# Patient Record
Sex: Female | Born: 1990 | Race: Asian | Hispanic: No | Smoking: Never smoker
Health system: Southern US, Community
[De-identification: ages and names within clinical notes are randomized; demographics above are authoritative.]

## PROBLEM LIST (undated history)

## (undated) DIAGNOSIS — O139 Gestational [pregnancy-induced] hypertension without significant proteinuria, unspecified trimester: Secondary | ICD-10-CM

## (undated) DIAGNOSIS — D509 Iron deficiency anemia, unspecified: Principal | ICD-10-CM

## (undated) HISTORY — DX: Iron deficiency anemia, unspecified: D50.9

---
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
## 2008-04-20 ENCOUNTER — Emergency Department (HOSPITAL_COMMUNITY): Admission: EM | Admit: 2008-04-20 | Discharge: 2008-04-20 | Payer: Self-pay | Admitting: Emergency Medicine

## 2009-10-25 IMAGING — US US OB FOLLOW-UP
1 series · 14 of 22 positions shown · non-contrast
Comparison: none

OBSTETRICAL ULTRASOUND:

 This ultrasound exam was performed in the [HOSPITAL] Ultrasound Department.  The OB US report was generated in the AS system, and faxed to the ordering physician.  This report is also available in [REDACTED] PACS.

[Series 1: us ob follow-up · 0.30mm/px · 14 of 22 slices shown]
[im 1/22]
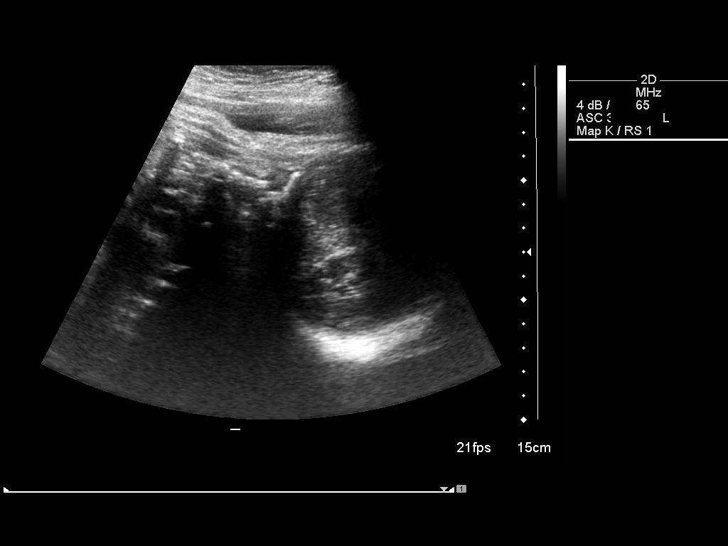
[im 3/22]
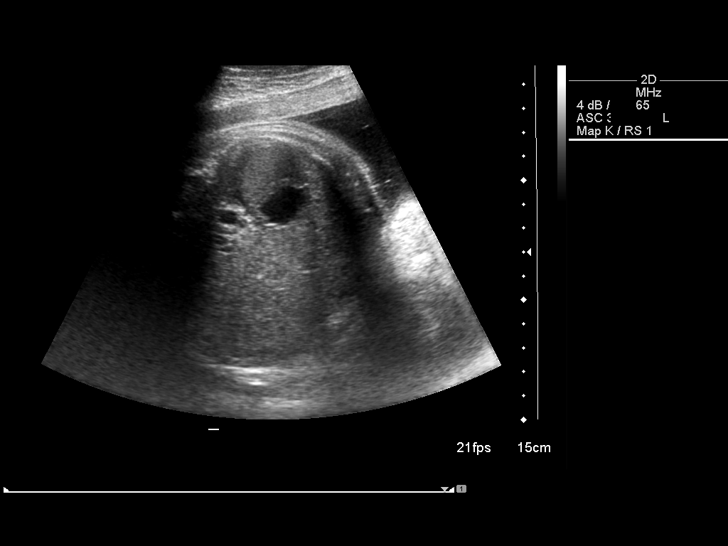
[im 4/22]
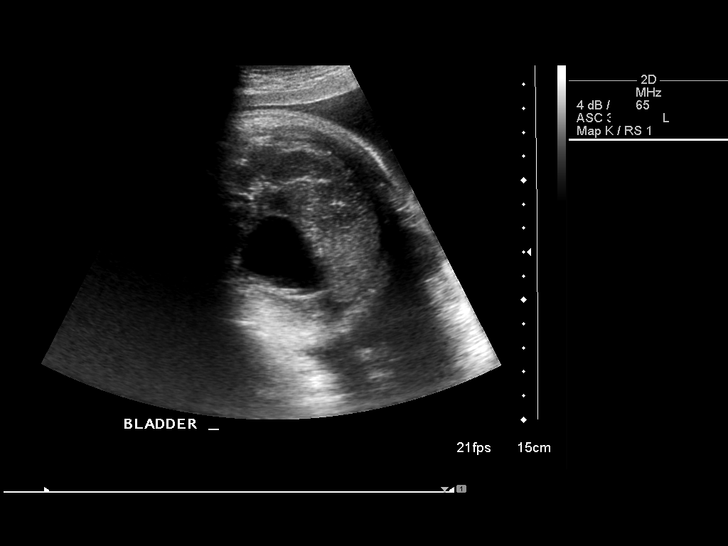
[im 6/22]
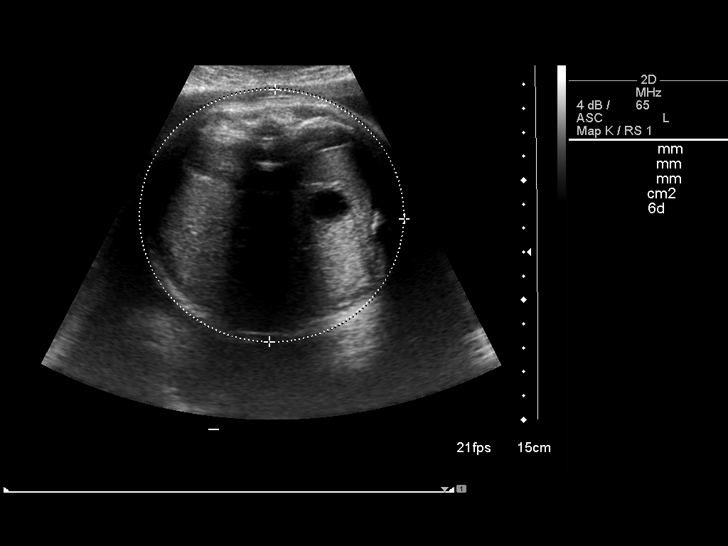
[im 8/22]
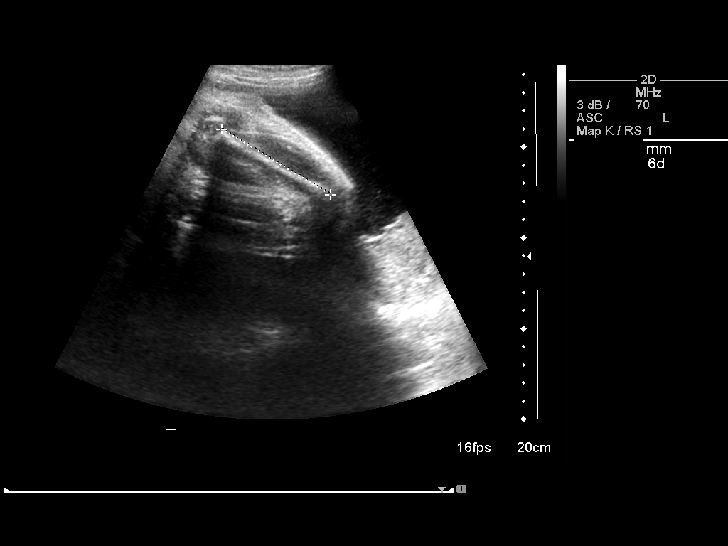
[im 9/22]
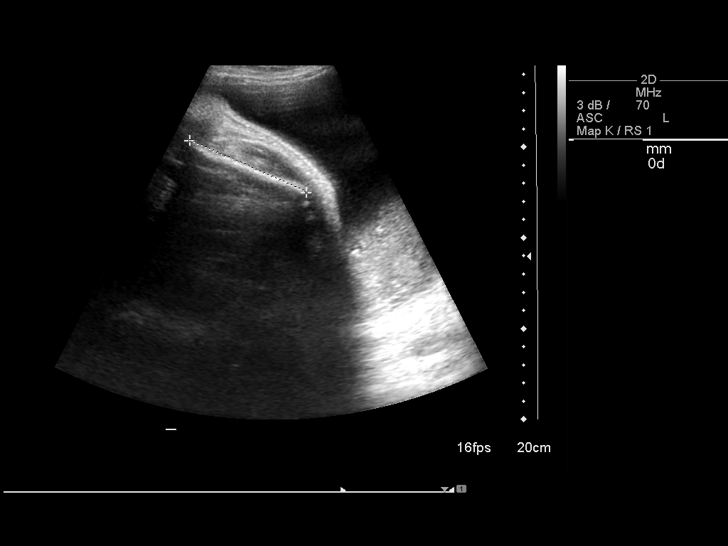
[im 11/22]
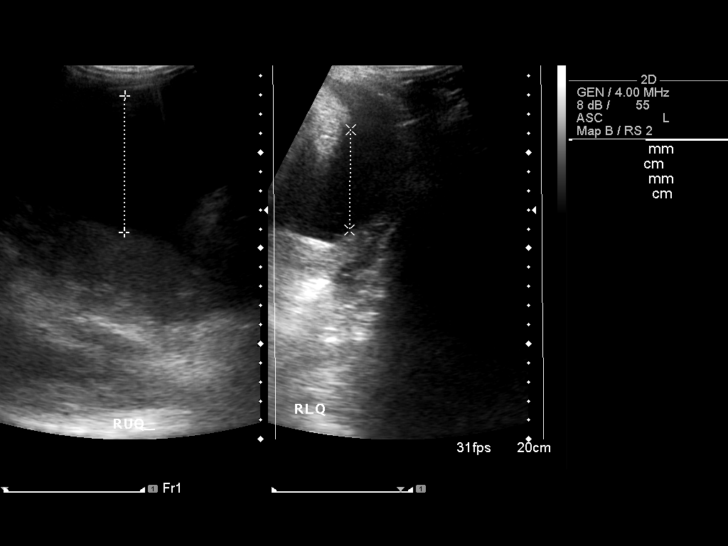
[im 12/22]
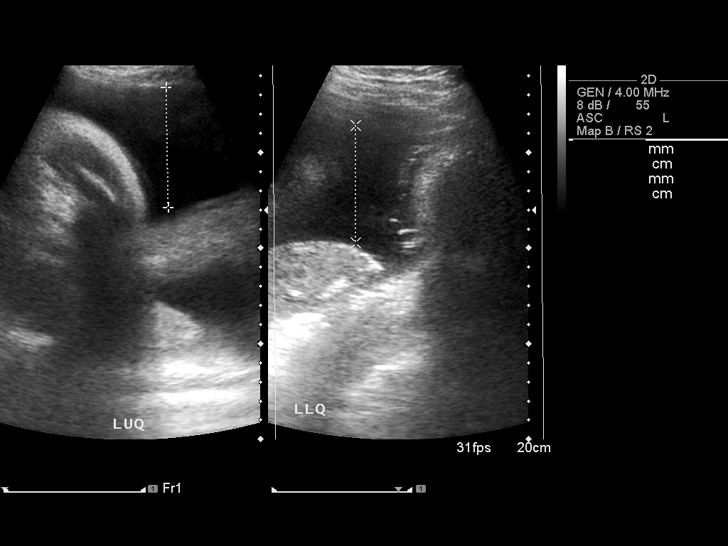
[im 14/22]
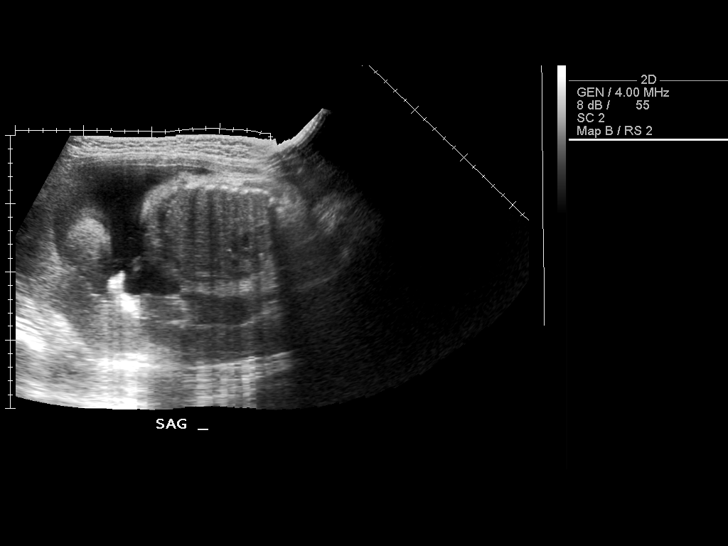
[im 15/22]
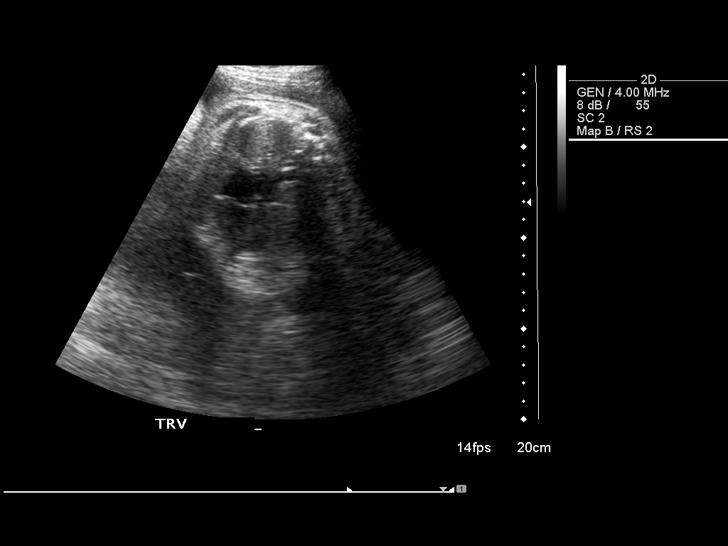
[im 17/22]
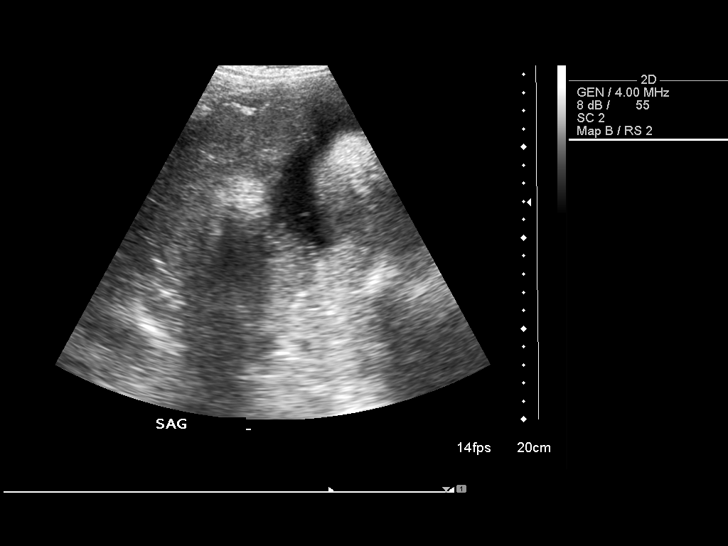
[im 19/22]
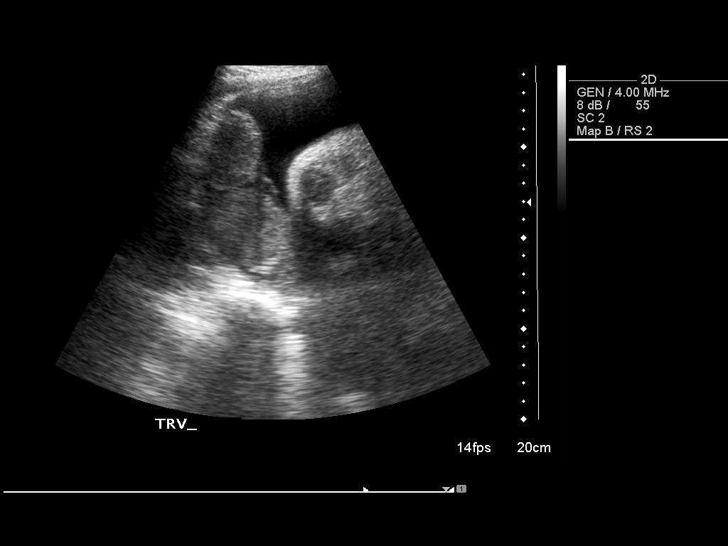
[im 20/22]
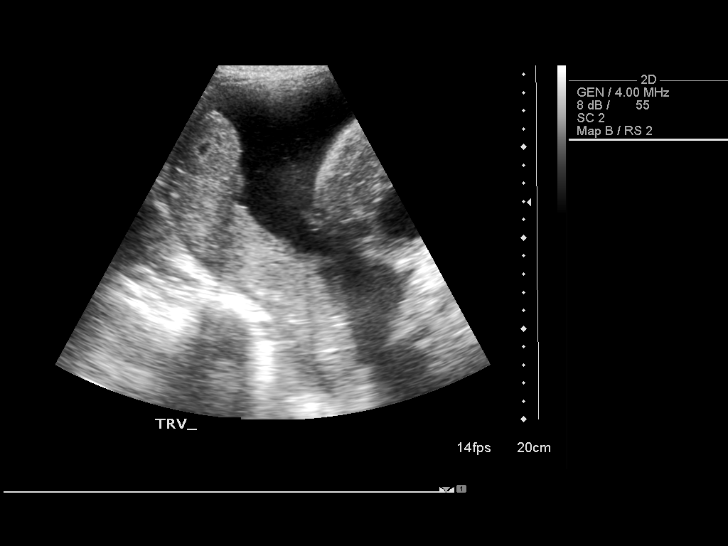
[im 22/22]
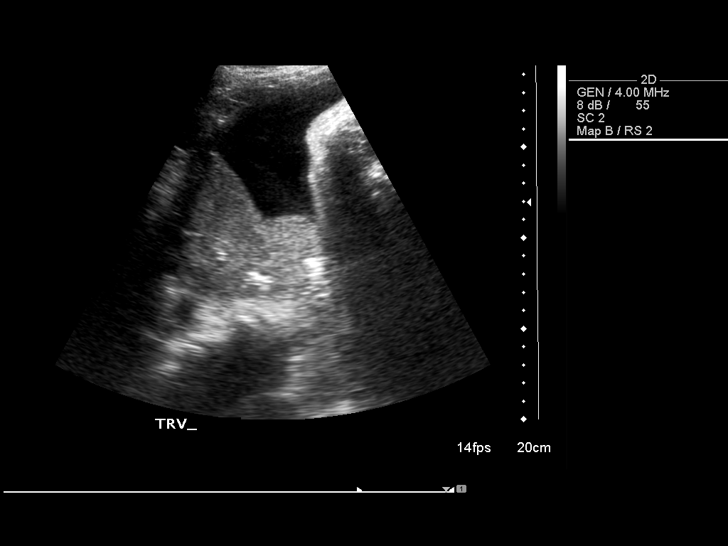

[14 of 22 positions shown; findings below may reference images not displayed]

IMPRESSION: See AS Obstetric US report.

## 2010-04-22 ENCOUNTER — Emergency Department (HOSPITAL_COMMUNITY)
Admission: EM | Admit: 2010-04-22 | Discharge: 2010-04-22 | Disposition: A | Discharge status: Home / Self Care | Payer: Medicaid Other | Attending: Emergency Medicine | Admitting: Emergency Medicine

## 2010-04-22 DIAGNOSIS — R070 Pain in throat: Secondary | ICD-10-CM | POA: Insufficient documentation

## 2010-07-11 NOTE — Discharge Summary (Signed)
[name], [name]                    ACCOUNT NO.:  [id number]   MEDICAL RECORD NO.:  [id number]          PATIENT TYPE:  INP   LOCATION:                                FACILITY:  [facility]   PHYSICIAN:  Norton Blizzard, MD    DATE OF BIRTH:  08/16/1990   DATE OF ADMISSION:  01/16/2007  DATE OF DISCHARGE:  01/23/2007                               DISCHARGE SUMMARY   ADMISSION DIAGNOSIS:  A 20 year old G1, P0, at 38-1/7 weeks, admitted  for induction of labor secondary to mild preeclampsia.   DISCHARGE DIAGNOSES:  1. Failed induction of labor.  2. Primary cesarean section for failure to progress.   BRIEF HOSPITAL COURSE:  The patient is a 20 year old G1, P0, at 38-1/7  weeks who was admitted on January 20, 2007, for induction of labor for  mild preeclampsia.  The patient's induction proceeded until she was 4 cm  dilated.  At that point, the patient did not have any further cervical  dilation despite adequate contractions for several hours.  Moreover, the  patient's fetal heart rate tracing started to have decreased variability  and concerning decelerations.  Given her failure to progress and  concerning fetal heart tracing, the patient was consented for cesarean  section.  She underwent an uncomplicated primary cesarean section.  Estimated blood loss 800 mL.  For further details of this operation,  please refer to the dictated report.  The patient had an uncomplicated  postoperative course.  She was in the ICU for magnesium sulfate seizure  prophylaxis for 24 hours after delivery, and her blood pressures  remained stable on oral labetalol.  On postoperative day #2, the patient  requested early discharge; given that her vital signs were stable, and  there were no signs and symptoms of infection, the decision was made to  send the patient home.   DISCHARGE CONDITION:  Stable.   DISCHARGE MEDICATIONS:  1. Percocet 5/325 one tablet by mouth every 6 hours as needed for      pain.  2. Motrin  600 mg 1 tablet by mouth every 6 hours as needed for pain.  3. Colace 100 mg 1 tablet by mouth twice daily.  4. Ferrous sulfate 325 mg 1 tablet by mouth twice daily.  5. Prenatal vitamins 1 tablet by mouth daily.  6. Labetalol 200mg  by mouth twice daily.   DISCHARGE INSTRUCTIONS:  The patient was told to call or return to the  hospital for any fevers, chills, sweats, problems with her incision,  severe headaches, visual changes, right upper quadrant or epigastric  pain, abnormal bleeding, incisional problems or any other concerns.  She  was told to avoid lifting anything greater than 15 pounds,  and to observe pelvic rest for the next six weeks.  As for her staple  removal, a [facility] nurse will come to her hourse on postoperative day  #6 (01/28/07). Patient will follow up at the [facility]  Department for her 6-week postpartum visit.      Norton Blizzard, MD  Electronically Signed  UAD/MEDQ  D:  01/23/2007  T:  01/23/2007  Job:  [id number]

## 2010-07-11 NOTE — Op Note (Signed)
NAMEALYSHIA, KERNAN                    ACCOUNT NO.:  [id number]   MEDICAL RECORD NO.:  [id number]          PATIENT TYPE:  INP   LOCATION:  9374                          FACILITY:  [facility]   PHYSICIAN:  Lesly Dukes, M.D. DATE OF BIRTH:  1990-12-08   DATE OF PROCEDURE:  01/21/2007  DATE OF DISCHARGE:                               OPERATIVE REPORT   PREOP DIAGNOSIS:  20 year-old gravida 1 para 0 at 38+ weeks  estimated gestational age with:  1. Failure to progress.  2. Chorioamnionitis.  3. Preeclampsia.  4. Decreased fetal heart rate tracing variability.   POSTOP DIAGNOSIS:  1. 20 year-old gravida 1 para 0 at 38+ weeks estimated      gestational age with:      a.     Failure to progress.      b.     Chorioamnionitis.      c.     Preeclampsia.      d.     Decreased fetal heart rate tracing variability.  2. Right occiptotransverse position, transverse.   PROCEDURE:  Primary low transverse cesarean section.   SURGEON:  Elsie Lincoln, MD.   ANESTHESIA:  Epidural.   PATHOLOGY:  Placenta.   EBL:  800.   COMPLICATIONS:  None.   FINDINGS:  Viable female infant, Apgars 9 at one minute and 9 at five  minutes, cord pH 7.21, grossly normal uterus, ovaries and fallopian  tubes.   PROCEDURE:  After informed consent was obtained the patient was taken to  the operating room where epidural anesthesia was found to be adequate.  The patient was placed in the dorsal supine position with a leftward  tilt and a Foley was already in the bladder.  A Pfannenstiel skin  incision was made with a scalpel and carried down to the fascia.  The  fascia was incised in the midline and this incision was extended  bilaterally with the Mayo scissors.  The superior and inferior aspects  of the fascial incision were grasped with Kocher clamps, tented up, and  dissected off sharply and bluntly from the underlying layers of rectus  muscles.  The rectus muscles were separated in the midline.  The  peritoneum was entered bluntly, extended both superiorly and inferiorly  with good visualization of the bladder.  Alexis retractor was placed  into the abdomen and a bladder flap was created and the bladder blade  was inserted.  The uterine incision was made in a transverse fashion in  the lower uterine segment and this incision was extended bilaterally  with the bandage scissors.  The head delivered atraumatically, the nose  and mouth were suctioned.  The rest of the baby's body delivered easily.  The cord was clamped and cut and the baby was handed off to the awaiting  pediatrician.  Cord gas was sent and cord blood was sent for type and  screen.  The placenta delivered manually and had a three-vessel cord.  The uterus was exteriorized and cleared of all clots and debris.  The  uterine incision was closed with #  0 Vicryl in a running locked fashion  and a second suture of #0 Vicryl was used to reinforce the incision and  aid in hemostasis.  The uterus was returned to the abdomen and was noted  to be hemostatic on tension.  The abdomen was copiously irrigated with  warm normal saline and again the uterus was noted to be hemostatic off  tension.  The peritoneum and rectus muscles were noted to be hemostatic.  The fascia was closed with #0 Vicryl in a running fashion and noted to  be hemostatic.  The subcutaneous tissue was copiously irrigated and  noted to be hemostatic.  The skin was closed with staples.  The patient  tolerated the procedure well.  Sponge, lap, instrument and needle count  were correct x2 and patient went to the recovery room in stable  condition.      Lesly Dukes, M.D.  Electronically Signed     KHL/MEDQ  D:  01/21/2007  T:  01/21/2007  Job:  [id number]

## 2010-12-05 LAB — CBC
HCT: 33.8 — ABNORMAL LOW
Hemoglobin: 9 — ABNORMAL LOW
Hemoglobin: 9.7 — ABNORMAL LOW
MCHC: 31.4
MCHC: 32.4
MCV: 70.4 — ABNORMAL LOW
Platelets: 295
Platelets: 322
Platelets: 374
RBC: 3.97
RBC: 4.8
RDW: 13.7
RDW: 14.1
WBC: 10.6
WBC: 13.9 — ABNORMAL HIGH

## 2010-12-05 LAB — I-STAT 8, (EC8 V) (CONVERTED LAB)
Bicarbonate: 21.6
Chloride: 110
Glucose, Bld: 93
HCT: 33 — ABNORMAL LOW
Hemoglobin: 11.2 — ABNORMAL LOW
Potassium: 4.2
Sodium: 139
TCO2: 23

## 2010-12-05 LAB — COMPREHENSIVE METABOLIC PANEL
Albumin: 2.8 — ABNORMAL LOW
Alkaline Phosphatase: 199 — ABNORMAL HIGH
CO2: 20
Calcium: 9.6
Chloride: 105
Creatinine, Ser: 0.68
Glucose, Bld: 78
Potassium: 3.6
Sodium: 134 — ABNORMAL LOW

## 2010-12-05 LAB — URINALYSIS, ROUTINE W REFLEX MICROSCOPIC
Protein, ur: NEGATIVE
pH: 7.5

## 2010-12-05 LAB — DIFFERENTIAL
Basophils Relative: 0
Eosinophils Absolute: 0.5
Lymphocytes Relative: 14 — ABNORMAL LOW
Lymphs Abs: 1.5
Monocytes Relative: 4
Neutrophils Relative %: 77 — ABNORMAL HIGH

## 2010-12-05 LAB — HEPATIC FUNCTION PANEL
ALT: 16
Alkaline Phosphatase: 127 — ABNORMAL HIGH
Total Bilirubin: 0.5

## 2010-12-05 LAB — URINALYSIS, DIPSTICK ONLY
Bilirubin Urine: NEGATIVE
Glucose, UA: NEGATIVE
Nitrite: NEGATIVE
Urobilinogen, UA: 0.2

## 2010-12-05 LAB — URINE MICROSCOPIC-ADD ON

## 2010-12-05 LAB — URINE CULTURE

## 2010-12-05 LAB — POCT I-STAT CREATININE
Creatinine, Ser: 0.8
Operator id: 234501

## 2011-09-29 ENCOUNTER — Ambulatory Visit: Payer: Self-pay | Admitting: Family Medicine

## 2011-09-29 VITALS — BP 111/76 | HR 87 | Temp 98.7°F | Resp 16 | Ht <= 58 in | Wt 131.0 lb

## 2011-09-29 DIAGNOSIS — R52 Pain, unspecified: Secondary | ICD-10-CM

## 2011-09-29 DIAGNOSIS — R51 Headache: Secondary | ICD-10-CM

## 2011-09-29 DIAGNOSIS — B349 Viral infection, unspecified: Secondary | ICD-10-CM

## 2011-09-29 DIAGNOSIS — R1031 Right lower quadrant pain: Secondary | ICD-10-CM

## 2011-09-29 DIAGNOSIS — I889 Nonspecific lymphadenitis, unspecified: Secondary | ICD-10-CM

## 2011-09-29 LAB — POCT URINALYSIS DIPSTICK
Bilirubin, UA: NEGATIVE
Blood, UA: NEGATIVE
Leukocytes, UA: NEGATIVE
Spec Grav, UA: 1.025
Urobilinogen, UA: 1
pH, UA: 6

## 2011-09-29 LAB — POCT CBC
Granulocyte percent: 47.5 %G (ref 37–80)
HCT, POC: 40.7 % (ref 37.7–47.9)
Lymph, poc: 2.5 (ref 0.6–3.4)
POC MID %: 11.5 %M (ref 0–12)
RBC: 5.62 M/uL — AB (ref 4.04–5.48)

## 2011-09-29 LAB — POCT UA - MICROSCOPIC ONLY
Casts, Ur, LPF, POC: NEGATIVE
Crystals, Ur, HPF, POC: NEGATIVE
Epithelial cells, urine per micros: NEGATIVE
Yeast, UA: NEGATIVE

## 2011-09-29 MED ORDER — BUTALBITAL-APAP-CAFFEINE 50-325-40 MG PO TABS: 1.0000 | ORAL_TABLET | Freq: Four times a day (QID) | ORAL | Status: DC | PRN

## 2011-09-29 MED ORDER — DOXYCYCLINE HYCLATE 100 MG PO TABS: 100.0000 mg | ORAL_TABLET | Freq: Two times a day (BID) | ORAL | Status: AC

## 2011-09-29 NOTE — Progress Notes (Signed)
Subjective: 21 year old [place] lady who comes in here complaining of not feeling well since last Saturday. She's had a headache which has been persistent. She took Tylenol and ibuprofen without relief. She has had just a lot of generalized body aches. It is persisted all week. 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 She has otherwise been healthy. Denies any upper respiratory infection. No sore throat. Cough. No nausea vomiting or diarrhea. Her last menstrual cycle was June 27 to July of 1. She has done a home pregnancy test which was negative. She does not think she is pregnant. She is not on contraception. She had one insect bite or 4, does not know what bit her. It stayed red or purple for a while. This was back in June. She denies fevers. She denies other rashes.  Objective: Alert oriented lady who doesn't look like she feels were well. Her TMs are normal. Eyes PERRLA. Fundi benign. Discs flat. Throat clear. Neck supple with nodes on the right and left in the posterior triangles. No   thyromegaly. No carotid bruits. Chest is clear to auscultation. Heart regular without murmurs. Abdomen soft. He is tender in the suprapubic and right lower quadrant areas. Extremities are unremarkable. Skin unremarkable with the exception of a little cluster of about 8 old purplish slightly crusted dots in a close pattern less than 5 cm in diameter. Each of little spots is carpal is palpable slightly crusted and measures 2 or 3 mm. Probably an old excoriated rash. She has a right post auricular node. No axillary or inguinal nodes  Assessment: Headache Myalgias Delayed menses Lymphadenopathy   Plan: Check labs  Results for orders placed in visit on 09/29/11  POCT CBC      Component Value Range   WBC 6.2  4.6 - 10.2 K/uL   Lymph, poc 2.5  0.6 - 3.4   POC LYMPH PERCENT 41.0  10 - 50 %L   MID (cbc) 0.7  0 - 0.9   POC MID % 11.5  0 - 12 %M   POC Granulocyte 2.9  2 - 6.9   Granulocyte  percent 47.5  37 - 80 %G   RBC 5.62 (*) 4.04 - 5.48 M/uL   Hemoglobin 12.2  12.2 - 16.2 g/dL   HCT, POC 16.1  09.6 - 47.9 %   MCV 72.5 (*) 80 - 97 fL   MCH, POC 21.7 (*) 27 - 31.2 pg   MCHC 30.0 (*) 31.8 - 35.4 g/dL   RDW, POC 04.5     Platelet Count, POC 397  142 - 424 K/uL   MPV 8.3  0 - 99.8 fL  POCT URINALYSIS DIPSTICK      Component Value Range   Color, UA yellow     Clarity, UA clear     Glucose, UA neg     Bilirubin, UA neg     Ketones, UA neg     Spec Grav, UA 1.025     Blood, UA neg     pH, UA 6.0     Protein, UA neg     Urobilinogen, UA 1.0     Nitrite, UA neg     Leukocytes, UA Negative    POCT UA - MICROSCOPIC ONLY      Component Value Range   WBC, Ur, HPF, POC 0-1     RBC, urine, microscopic 0-2     Bacteria, U Microscopic trace     Mucus, UA small  Epithelial cells, urine per micros neg     Crystals, Ur, HPF, POC neg     Casts, Ur, LPF, POC neg     Yeast, UA neg    POCT URINE PREGNANCY      Component Value Range   Preg Test, Ur Negative       Plan: CBC, UA, urine hCG

## 2011-09-29 NOTE — Patient Instructions (Addendum)
This probably is some kind of virus infection, though in tick season we need to always be cautious about the risks of some of the possible tick infections. With the enlarged lymph glands I'm going to put you on an antibiotic.  Doxycycline 100 mg twice daily for infection  Take tylenol or ibuprofen for the headache.  If not doing better, take Fioricet for headache  Return if no better or if worse.  I will let you know the result of the mono test

## 2011-10-02 LAB — EPSTEIN-BARR VIRUS VCA, IGM: EBV VCA IgM: <10 U/mL (ref <36.0)

## 2011-10-08 ENCOUNTER — Encounter: Payer: Self-pay | Admitting: *Deleted

## 2011-11-30 ENCOUNTER — Ambulatory Visit (INDEPENDENT_AMBULATORY_CARE_PROVIDER_SITE_OTHER): Payer: 59 | Admitting: Physician Assistant

## 2011-11-30 VITALS — BP 98/64 | HR 112 | Temp 98.7°F | Resp 16 | Ht <= 58 in | Wt 131.0 lb

## 2011-11-30 DIAGNOSIS — R300 Dysuria: Secondary | ICD-10-CM

## 2011-11-30 DIAGNOSIS — N390 Urinary tract infection, site not specified: Secondary | ICD-10-CM

## 2011-11-30 LAB — POCT URINALYSIS DIPSTICK
Glucose, UA: NEGATIVE
Protein, UA: 100
Spec Grav, UA: 1.02
Urobilinogen, UA: 0.2
pH, UA: 6.5

## 2011-11-30 LAB — POCT UA - MICROSCOPIC ONLY
Casts, Ur, LPF, POC: NEGATIVE
Crystals, Ur, HPF, POC: NEGATIVE
Mucus, UA: POSITIVE
Yeast, UA: NEGATIVE

## 2011-11-30 MED ORDER — SULFAMETHOXAZOLE-TMP DS 800-160 MG PO TABS: 1.0000 | ORAL_TABLET | Freq: Two times a day (BID) | ORAL | Status: DC

## 2011-11-30 MED ORDER — PHENAZOPYRIDINE HCL 200 MG PO TABS: 200.0000 mg | ORAL_TABLET | Freq: Three times a day (TID) | ORAL | Status: DC | PRN

## 2011-11-30 NOTE — Progress Notes (Signed)
  Subjective:    Patient ID: Sandra Olsen, female    DOB: [birth year], 21 y.o.   MRN: [id number]  HPI  21 yr old female presents with a 1 day h/o dysuria and frequency. No f/c. No abdominal pain. No pelvic pain or vaginal discharge.  Review of Systems  All other systems reviewed and are negative.       Objective:   Physical Exam  Nursing note and vitals reviewed. Constitutional: She is oriented to person, place, and time. She appears well-developed and well-nourished.  HENT:  Head: Normocephalic and atraumatic.  Cardiovascular: Normal rate, regular rhythm and normal heart sounds.   Pulmonary/Chest: Effort normal and breath sounds normal.  Abdominal:       No CVA tenderness  Neurological: She is alert and oriented to person, place, and time.  Skin: Skin is warm and dry.  Psychiatric: She has a normal mood and affect. Her behavior is normal.     Results for orders placed in visit on 11/30/11  POCT UA - MICROSCOPIC ONLY      Component Value Range   WBC, Ur, HPF, POC 18-25     RBC, urine, microscopic 18-20     Bacteria, U Microscopic 2+     Mucus, UA positive     Epithelial cells, urine per micros 3-5     Crystals, Ur, HPF, POC neg     Casts, Ur, LPF, POC neg     Yeast, UA neg    POCT URINALYSIS DIPSTICK      Component Value Range   Color, UA yellow     Clarity, UA cloudy     Glucose, UA neg     Bilirubin, UA neg     Ketones, UA neg     Spec Grav, UA 1.020     Blood, UA large     pH, UA 6.5     Protein, UA 100     Urobilinogen, UA 0.2     Nitrite, UA positive     Leukocytes, UA large (3+)          Assessment & Plan:  UTI-start septra. Increase water intake. Rest. Urine sent for culture.

## 2011-11-30 NOTE — Patient Instructions (Addendum)
Drink 60-80 ounces water/day

## 2011-12-02 LAB — URINE CULTURE: Colony Count: >=100000

## 2012-07-04 ENCOUNTER — Ambulatory Visit (INDEPENDENT_AMBULATORY_CARE_PROVIDER_SITE_OTHER): Payer: 59 | Admitting: Family Medicine

## 2012-07-04 VITALS — BP 112/80 | HR 78 | Temp 98.6°F | Resp 16 | Ht <= 58 in | Wt 128.4 lb

## 2012-07-04 DIAGNOSIS — Z0000 Encounter for general adult medical examination without abnormal findings: Secondary | ICD-10-CM

## 2012-07-04 NOTE — Patient Instructions (Addendum)
I will be in touch with your pap result.  I do recommend that you have an eye exam because your vision is 20/50 without correction.

## 2012-07-04 NOTE — Progress Notes (Signed)
[facility] [address], [place] [address] [id number]  Date:  07/04/2012   Name:  Sandra Olsen   DOB:  May 30, 1990   MRN:  [id number]  PCP:  No primary provider on file.    Chief Complaint: Annual Exam   History of Present Illness:  Sandra Olsen is a 22 y.o. very pleasant female patient who presents with the following:  She is here today for a CPE and form to return to school at [employer].  She plans to study medical assisting.  Per her report the HD has already gone over her shots and done her PPD- she does not need these things addressed today.  She does not want any labs performed (BW).   She is generally healthy.   Her LMP was just about one month ago.  She is often irregular so she is not concerned about not having her menses yet.  She has a 24 year old girl at home.  They are not using any contraception at this time.    She declines to have an HCG today.  Here with her female partner.  Refused to do anything not required for her [employer] physical (besides pap).  Refused to do whisper test or color vision test although it was on her form.  She would like to do a pap today as she is overdue.  No history of abnormal pap that she can recall.    There are no active problems to display for this patient.   No past medical history on file.  Past Surgical History  Procedure Laterality Date  . Cesarean section      History  Substance Use Topics  . Smoking status: Never Smoker   . Smokeless tobacco: Not on file  . Alcohol Use: No    Family History  Problem Relation Age of Onset  . Diabetes Father     No Known Allergies  Medication list has been reviewed and updated.  No current outpatient prescriptions on file prior to visit.   No current facility-administered medications on file prior to visit.    Review of Systems:  As per HPI- otherwise negative.   Physical Examination: Filed Vitals:   07/04/12 1723  BP: 112/80  Pulse: 78  Temp: 98.6 F (37 C)   Resp: 16   Filed Vitals:   07/04/12 1723  Height: 4' 9.75" (1.467 m)  Weight: 128 lb 6.4 oz (58.242 kg)   Body mass index is 27.06 kg/(m^2). Ideal Body Weight: Weight in (lb) to have BMI = 25: 118.3  GEN: WDWN, NAD, Non-toxic, A & O x 3 HEENT: Atraumatic, Normocephalic. Neck supple. No masses, No LAD.  Bilateral TM wnl, oropharynx normal.  PEERL,EOMI.   Ears and Nose: No external deformity. CV: RRR, No M/G/R. No JVD. No thrill. No extra heart sounds. PULM: CTA B, no wheezes, crackles, rhonchi. No retractions. No resp. distress. No accessory muscle use. ABD: S, NT, ND, +BS. No rebound. No HSM. EXTR: No c/c/e NEURO Normal gait.  PSYCH: Normally interactive. Conversant. Not depressed or anxious appearing.  Calm demeanor.  Breast exam: normal, no masses, dimpling or discharge GU: normal internal and external exam, no CMT, no adnexal masses or tenderness  Performed pap with spatula only (no brush).   Assessment and Plan: Physical exam, annual - Plan: Pap IG, CT/NG w/ reflex HPV when ASC-U  Performed pap for her today and completed form.  She states she does have glasses but did not bring them  with her today.   Will plan further follow- up pending labs. Refused any other services or labs today.  Advised her to take a home HCG if no menses in the next 2 weeks.    Signed Abbe Amsterdam, MD

## 2012-07-07 LAB — PAP IG, CT-NG, RFX HPV ASCU: Chlamydia Probe Amp: NEGATIVE

## 2012-07-08 ENCOUNTER — Encounter: Payer: Self-pay | Admitting: Family Medicine

## 2012-07-16 ENCOUNTER — Telehealth: Payer: Self-pay

## 2012-07-16 NOTE — Telephone Encounter (Signed)
Pt came in on May 9th and gave dr the phy form and she was only give copy of the form but she needs the original copy Call back number is [phone]

## 2012-07-17 NOTE — Telephone Encounter (Signed)
Patient called this morning. I told her that I will look for her form and that it may have already be sent for scanning. If that's the case we would no longer have the original. She would have to bring another form if a copy is not acceptable. Will call her back and ok to leave voicemail per patient.

## 2012-07-17 NOTE — Telephone Encounter (Signed)
Located form. It was only one page. Copied form for our records. Lmom for patient to pick up form.

## 2012-11-02 ENCOUNTER — Ambulatory Visit (INDEPENDENT_AMBULATORY_CARE_PROVIDER_SITE_OTHER): Payer: 59 | Admitting: Physician Assistant

## 2012-11-02 VITALS — BP 116/68 | HR 82 | Temp 98.4°F | Resp 16 | Ht <= 58 in | Wt 126.0 lb

## 2012-11-02 DIAGNOSIS — R319 Hematuria, unspecified: Secondary | ICD-10-CM

## 2012-11-02 DIAGNOSIS — R300 Dysuria: Secondary | ICD-10-CM

## 2012-11-02 LAB — POCT URINALYSIS DIPSTICK
Bilirubin, UA: NEGATIVE
Nitrite, UA: NEGATIVE
Protein, UA: 30
pH, UA: 7

## 2012-11-02 LAB — POCT UA - MICROSCOPIC ONLY
Epithelial cells, urine per micros: NEGATIVE
Yeast, UA: NEGATIVE

## 2012-11-02 MED ORDER — PHENAZOPYRIDINE HCL 200 MG PO TABS: 200.0000 mg | ORAL_TABLET | Freq: Three times a day (TID) | ORAL | Status: DC | PRN

## 2012-11-02 MED ORDER — CIPROFLOXACIN HCL 500 MG PO TABS: 500.0000 mg | ORAL_TABLET | Freq: Two times a day (BID) | ORAL | Status: DC

## 2012-11-02 NOTE — Patient Instructions (Signed)
Get plenty of rest and drink at least 64 ounces of water daily. Complete the antibiotic. It is important that you return in about 2 weeks to reassess the blood in the urine (it should resolve completely).  Come either before your next period starts, or after your next period stops completely.

## 2012-11-02 NOTE — Progress Notes (Signed)
  Subjective:    Patient ID: Sandra Olsen, female    DOB: Mar 10, 1990, 22 y.o.   MRN: [id number]  HPI This 22 y.o. female presents for evaluation of dysuria, urgency and frequency beginning about 12 noon today.  Some hematuria. No fever. Chills. No back or abdominal pain.  No vaginal discharge.  No nausea, vomiting or diarrhea.  Monogamous sex with her husband.  Medications, allergies, past medical history, surgical history, family history, social history and problem list reviewed.  Review of Systems As above.    Objective:   Physical Exam  Vitals reviewed. Constitutional: She is oriented to person, place, and time. Vital signs are normal. She appears well-developed and well-nourished. She is active and cooperative. No distress.  HENT:  Head: Normocephalic and atraumatic.  Cardiovascular: Normal rate, regular rhythm and normal heart sounds.   Pulmonary/Chest: Effort normal and breath sounds normal.  Abdominal: Soft. Normal appearance and bowel sounds are normal. She exhibits no distension and no mass. There is no hepatosplenomegaly. There is tenderness in the suprapubic area. There is no rigidity, no rebound, no guarding, no CVA tenderness, no tenderness at McBurney's point and negative Murphy's sign. No hernia.  Musculoskeletal: Normal range of motion.       Lumbar back: Normal.  Neurological: She is alert and oriented to person, place, and time.  Skin: Skin is warm and dry. No rash noted. She is not diaphoretic. No pallor.  Psychiatric: She has a normal mood and affect. Her speech is normal and behavior is normal. Judgment normal.    Results for orders placed in visit on 11/02/12  POCT UA - MICROSCOPIC ONLY      Result Value Range   WBC, Ur, HPF, POC tntc     RBC, urine, microscopic tntc     Bacteria, U Microscopic trace     Mucus, UA neg     Epithelial cells, urine per micros neg     Crystals, Ur, HPF, POC neg     Casts, Ur, LPF, POC renal tubular     Yeast, UA neg    POCT  URINALYSIS DIPSTICK      Result Value Range   Color, UA lt red     Clarity, UA cloudy     Glucose, UA neg     Bilirubin, UA neg     Ketones, UA neg     Spec Grav, UA 1.010     Blood, UA large     pH, UA 7.0     Protein, UA 30     Urobilinogen, UA 0.2     Nitrite, UA neg     Leukocytes, UA small (1+)           Assessment & Plan:  Dysuria - Plan: POCT UA - Microscopic Only, POCT urinalysis dipstick, Urine culture, ciprofloxacin (CIPRO) 500 MG tablet, phenazopyridine (PYRIDIUM) 200 MG tablet  Hematuria - return in 2 weeks for repeat UA with micro to verify resolution of hematuria.  Fernande Bras, PA-C Physician Assistant-Certified [facility]

## 2012-11-05 LAB — URINE CULTURE

## 2013-06-02 ENCOUNTER — Encounter: Payer: Self-pay | Admitting: Internal Medicine

## 2013-06-02 ENCOUNTER — Ambulatory Visit (INDEPENDENT_AMBULATORY_CARE_PROVIDER_SITE_OTHER): Payer: Self-pay | Admitting: Internal Medicine

## 2013-06-02 DIAGNOSIS — R21 Rash and other nonspecific skin eruption: Secondary | ICD-10-CM

## 2013-06-02 MED ORDER — DIPHENHYDRAMINE HCL 25 MG PO CAPS: 25.0000 mg | ORAL_CAPSULE | Freq: Once | ORAL | Status: DC

## 2013-06-02 NOTE — Patient Instructions (Signed)
Take Benadryl as needed for itching. Monitor the rash closely and give us a call if the rash worsens, or if you develop any other symptoms or rashes.

## 2013-06-02 NOTE — Assessment & Plan Note (Signed)
Unclear etiology. Suspect secondary to contact dermatitis from either allergic or irritant contact dermatitis. No signs of bite/sting on exam and no other systemic signs or symptoms.   Plan: Conservative management Watch the lesion closely and monitor. Educated patient regarding concerning signs or symptoms.  Recommended to use either oral or topical benadryl for symptomatic relief. Recommended to come back to the office if the rash doesn't improve in a week or if the rash worsens.

## 2013-06-02 NOTE — Progress Notes (Signed)
   Subjective:   Patient ID: Sandra Olsen female   DOB: 05/03/1990 23 y.o.   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 Patient states that she noticed slight itching over the right dorsal aspect of her hand and after a few scratches developed this erythematous rash over the right hand. She reports mild itching and slight burning sensation over the affected area. She denies any contact with any chemicals or any known exposure. She denies any such events in the past. She denies anything bitten and she denies exposure to mosquitos or ticks. She denies travelling or exposure to the wooded areas. She denies any fever, chills, body pains. She denies any other complaints. She states she didn't use any OTC medications.  She denies any other complaints.     No past medical history on file. Current Outpatient Prescriptions  Medication Sig Dispense Refill  . ciprofloxacin (CIPRO) 500 MG tablet Take 1 tablet (500 mg total) by mouth 2 (two) times daily.  10 tablet  0  . phenazopyridine (PYRIDIUM) 200 MG tablet Take 1 tablet (200 mg total) by mouth 3 (three) times daily as needed for pain.  10 tablet  0   No current facility-administered medications for this visit.   Family History  Problem Relation Age of Onset  . Diabetes Father    History   Social History  . Marital Status: Married    Spouse Name: N/A    Number of Children: 1  . Years of Education: N/A   Occupational History  . Student- Medical Assistant     [employer]   Social History Main Topics  . Smoking status: Never Smoker   . Smokeless tobacco: Never Used  . Alcohol Use: No  . Drug Use: No  . Sexual Activity: Yes    Partners: Male    Birth Control/ Protection: Condom     Comment: "most of the time"   Other Topics Concern  . Not on file   Social History Narrative   Originally from [place].  [place]ame [place] the  [place] age 23 years.  Lives with her husband and their daughter.   Review of Systems: Pertinent items are noted in HPI. Objective:   Physical Exam: There were no vitals filed for this visit.  Examination of the Right Hand: There is erythematous macular rash over the dorsal aspect of the right hand located between the wrist and MCP joints of thumb, index and middle finger and measures approximately 1 inch x2 inch in width and length. The rash has irregular borders and no clear cut demarcation from the surrounding skin. There is no visible bite/sting mark noted. There are is no excoriation of the skin over the affected area and there are vesicules, bullae, blisters noted. There are few scratch marks noted over the area. There are other rashes noted any where else.

## 2013-06-03 NOTE — Progress Notes (Signed)
Case discussed with Dr. Boggala at time of visit.  We reviewed the resident's history and exam and pertinent patient test results.  I agree with the assessment, diagnosis, and plan of care documented in the resident's note. 

## 2013-06-04 ENCOUNTER — Ambulatory Visit (INDEPENDENT_AMBULATORY_CARE_PROVIDER_SITE_OTHER): Payer: Self-pay | Admitting: Internal Medicine

## 2013-06-04 ENCOUNTER — Encounter: Payer: Self-pay | Admitting: Internal Medicine

## 2013-06-04 VITALS — BP 122/71 | HR 74 | Temp 96.7°F | Ht 60.0 in

## 2013-06-04 DIAGNOSIS — R21 Rash and other nonspecific skin eruption: Secondary | ICD-10-CM

## 2013-06-04 MED ORDER — TRIAMCINOLONE ACETONIDE 0.1 % EX OINT: 1.0000 "application " | TOPICAL_OINTMENT | Freq: Two times a day (BID) | CUTANEOUS | Status: DC

## 2013-06-04 NOTE — Progress Notes (Signed)
   Subjective:    Patient ID: Sandra Olsen, female    DOB: 02/26/1991, 23 y.o.   MRN: [id number]  HPI Comments: 23 y.o PMH ? UTI. BP 122/71 HR 74, 96.7.    She presents for: 1) She was seen 4/7 for right hand rash but today the rash darker with red area in the middle.  Red rash to right hand started Monday when she was taking her class picture in the gazebo outside.  She states a bug may have bitten her on the right hand but she did not see the bug if so.  She scratched her hand due to itching on Monday and late Monday the back of her right hand was red.  Also the back of her hand at one time was burning but no longer.  She was seen 06/02/13 but the area was smaller.  Today the rash is larger and more purple/dark hue on the right hand moving to the left hand (thumb).  Itching and burning. On last visit patient was encouraged to use topical Benadryl/Hydrocortisone Cr. but she did not. She did take one dose of Benadryl.  The rash is also on her right hand index, 2nd, 3rd fingers proximally.  When she moves her right index finger pain moves up her forearm which is new.  She denies travel, FH autoimmune d/o, new lotions/soaps/jewlery.  Her husband and daughter do not have a rash.        Review of Systems  Constitutional: Negative for fever, chills, appetite change and fatigue.  Skin: Positive for rash.       Objective:   Physical Exam  Nursing note and vitals reviewed. Constitutional: She is oriented to person, place, and time. She appears well-developed and well-nourished. No distress.  HENT:  Head: Normocephalic and atraumatic.  Eyes: Conjunctivae are normal.  Neurological: She is alert and oriented to person, place, and time.  Skin:     Psychiatric: She has a normal mood and affect. Her speech is normal and behavior is normal. Judgment and thought content normal. Cognition and memory are normal.          Assessment & Plan:  F/u with Dermatology 4/10 Dr.Wentworth

## 2013-06-04 NOTE — Assessment & Plan Note (Addendum)
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  Ddx eczematous change, contact, ?bug bite reaction. If not resolving consider systemic causes (i.e lupus, etc) Referred to dermatology appt 06/04/13 Rx [facility] 0.1%oint bid (not to face, groin axilla) Consider checking CMET, CBC, ESR, ANA if rash not resolving

## 2013-06-04 NOTE — Patient Instructions (Signed)
We are still unsure what this rash is could be contact, eczema or other Steroid Cream Do NOT use to face, groin, axilla  Use steroid cream to rash twice a day  Follow up with dermatology  Use mild soaps and lotions to your hands   Triamcinolone skin cream, ointment, lotion, or aerosol What is this medicine? TRIAMCINOLONE (trye am SIN oh lone) is a corticosteroid. It is used on the skin to reduce swelling, redness, itching, and allergic reactions. This medicine may be used for other purposes; ask your health care provider or pharmacist if you have questions. COMMON BRAND NAME(S): Aristocort A, Aristocort HP, Aristocort, Cinalog, Cinolar, DERMASORB TA Complete, Flutex, Kenalog, Pediaderm TA, SP Rx 228 , Triacet , Trianex , Triderm  What should I tell my health care provider before I take this medicine? They need to know if you have any of these conditions: -diabetes -infection, like tuberculosis, herpes, or fungal infection -large areas of burned or damaged skin -skin wasting or thinning -an unusual or allergic reaction to triamcinolone, corticosteroids, other medicines, foods, dyes, or preservatives -pregnant or trying to get pregnant -breast-feeding How should I use this medicine? This medicine is for external use only. Do not take by mouth. Follow the directions on the prescription label. Wash your hands before and after use. Apply a thin film of medicine to the affected area. Do not cover with a bandage or dressing unless your doctor or health care professional tells you to. Do not use on healthy skin or over large areas of skin. Do not get this medicine in your eyes. If you do, rinse out with plenty of cool tap water. It is important not to use more medicine than prescribed. Do not use your medicine more often than directed. Talk to your pediatrician regarding the use of this medicine in children. Special care may be needed. Elderly patients are more likely to have damaged skin through  aging, and this may increase side effects. This medicine should only be used for brief periods and infrequently in older patients. Overdosage: If you think you have taken too much of this medicine contact a poison control center or emergency room at once. NOTE: This medicine is only for you. Do not share this medicine with others. What if I miss a dose? If you miss a dose, use it as soon as you can. If it is almost time for your next dose, use only that dose. Do not use double or extra doses. What may interact with this medicine? Interactions are not expected. This list may not describe all possible interactions. Give your health care provider a list of all the medicines, herbs, non-prescription drugs, or dietary supplements you use. Also tell them if you smoke, drink alcohol, or use illegal drugs. Some items may interact with your medicine. What should I watch for while using this medicine? Tell your doctor or health care professional if your symptoms do not start to get better within one week. Do not use for more than 14 days. Do not use on healthy skin or over large areas of skin. Tell your doctor or health care professional if you are exposed to anyone with measles or chickenpox, or if you develop sores or blisters that do not heal properly. Do not use an airtight bandage to cover the affected area unless your doctor or health care professional tells you to. If you are to cover the area, follow the instructions carefully. Covering the area where the medicine is applied can increase  the amount that passes through the skin and increases the risk of side effects. If treating the diaper area of a child, avoid covering the treated area with tight-fitting diapers or plastic pants. This may increase the amount of medicine that passes through the skin and increase the risk of serious side effects. What side effects may I notice from receiving this medicine? Side effects that you should report to your doctor  or health care professional as soon as possible: -burning or itching of the skin -dark red spots on the skin -infection -painful, red, pus filled blisters in hair follicles -thinning of the skin, sunburn more likely especially on the face Side effects that usually do not require medical attention (report to your doctor or health care professional if they continue or are bothersome): -dry skin, irritation -unusual increased growth of hair on the face or body This list may not describe all possible side effects. Call your doctor for medical advice about side effects. You may report side effects to FDA at 1-800-FDA-1088. Where should I keep my medicine? Keep out of the reach of children. Store at room temperature between 15 and 30 degrees C (59 and 86 degrees F). Do not freeze. Throw away any unused medicine after the expiration date. NOTE: This sheet is a summary. It may not cover all possible information. If you have questions about this medicine, talk to your doctor, pharmacist, or health care provider.  2014, Elsevier/Gold Standard. (2007-04-25 16:15:53)  Eczema Eczema, also called atopic dermatitis, is a skin disorder that causes inflammation of the skin. It causes a red rash and dry, scaly skin. The skin becomes very itchy. Eczema is generally worse during the cooler winter months and often improves with the warmth of summer. Eczema usually starts showing signs in infancy. Some children outgrow eczema, but it may last through adulthood.  CAUSES  The exact cause of eczema is not known, but it appears to run in families. People with eczema often have a family history of eczema, allergies, asthma, or hay fever. Eczema is not contagious. Flare-ups of the condition may be caused by:   Contact with something you are sensitive or allergic to.   Stress. SIGNS AND SYMPTOMS  Dry, scaly skin.   Red, itchy rash.   Itchiness. This may occur before the skin rash and may be very intense.   DIAGNOSIS  The diagnosis of eczema is usually made based on symptoms and medical history. TREATMENT  Eczema cannot be cured, but symptoms usually can be controlled with treatment and other strategies. A treatment plan might include:  Controlling the itching and scratching.   Use over-the-counter antihistamines as directed for itching. This is especially useful at night when the itching tends to be worse.   Use over-the-counter steroid creams as directed for itching.   Avoid scratching. Scratching makes the rash and itching worse. It may also result in a skin infection (impetigo) due to a break in the skin caused by scratching.   Keeping the skin well moisturized with creams every day. This will seal in moisture and help prevent dryness. Lotions that contain alcohol and water should be avoided because they can dry the skin.   Limiting exposure to things that you are sensitive or allergic to (allergens).   Recognizing situations that cause stress.   Developing a plan to manage stress.  HOME CARE INSTRUCTIONS   Only take over-the-counter or prescription medicines as directed by your health care provider.   Do not use anything on the  skin without checking with your health care provider.   Keep baths or showers short (5 minutes) in warm (not hot) water. Use mild cleansers for bathing. These should be unscented. You may add nonperfumed bath oil to the bath water. It is best to avoid soap and bubble bath.   Immediately after a bath or shower, when the skin is still damp, apply a moisturizing ointment to the entire body. This ointment should be a petroleum ointment. This will seal in moisture and help prevent dryness. The thicker the ointment, the better. These should be unscented.   Keep fingernails cut short. Children with eczema may need to wear soft gloves or mittens at night after applying an ointment.   Dress in clothes made of cotton or cotton blends. Dress lightly,  because heat increases itching.   A child with eczema should stay away from anyone with fever blisters or cold sores. The virus that causes fever blisters (herpes simplex) can cause a serious skin infection in children with eczema. SEEK MEDICAL CARE IF:   Your itching interferes with sleep.   Your rash gets worse or is not better within 1 week after starting treatment.   You see pus or soft yellow scabs in the rash area.   You have a fever.   You have a rash flare-up after contact with someone who has fever blisters.  Document Released: 02/10/2000 Document Revised: 12/03/2012 Document Reviewed: 09/15/2012 [facility] Patient Information 2014 [place], [place].  Rash A rash is a change in the color or texture of your skin. There are many different types of rashes. You may have other problems that accompany your rash. CAUSES   Infections.  Allergic reactions. This can include allergies to pets or foods.  Certain medicines.  Exposure to certain chemicals, soaps, or cosmetics.  Heat.  Exposure to poisonous plants.  Tumors, both cancerous and noncancerous. SYMPTOMS   Redness.  Scaly skin.  Itchy skin.  Dry or cracked skin.  Bumps.  Blisters.  Pain. DIAGNOSIS  Your caregiver may do a physical exam to determine what type of rash you have. A skin sample (biopsy) may be taken and examined under a microscope. TREATMENT  Treatment depends on the type of rash you have. Your caregiver may prescribe certain medicines. For serious conditions, you may need to see a skin doctor (dermatologist). HOME CARE INSTRUCTIONS   Avoid the substance that caused your rash.  Do not scratch your rash. This can cause infection.  You may take cool baths to help stop itching.  Only take over-the-counter or prescription medicines as directed by your caregiver.  Keep all follow-up appointments as directed by your caregiver. SEEK IMMEDIATE MEDICAL CARE IF:  You have increasing pain,  swelling, or redness.  You have a fever.  You have new or severe symptoms.  You have body aches, diarrhea, or vomiting.  Your rash is not better after 3 days. MAKE SURE YOU:  Understand these instructions.  Will watch your condition.  Will get help right away if you are not doing well or get worse. Document Released: 02/02/2002 Document Revised: 05/07/2011 Document Reviewed: 11/27/2010 [facility] Patient Information 2014 [place], [place].

## 2013-06-10 NOTE — Progress Notes (Signed)
I saw and evaluated the patient.  I personally confirmed the key portions of the history and exam documented by Dr. McLean and I reviewed pertinent patient test results.  The assessment, diagnosis, and plan were formulated together and I agree with the documentation in the resident's note.    

## 2013-08-10 NOTE — Progress Notes (Signed)
This encounter was created in error - please disregard.

## 2013-11-21 ENCOUNTER — Ambulatory Visit (INDEPENDENT_AMBULATORY_CARE_PROVIDER_SITE_OTHER): Payer: BC Managed Care – PPO | Admitting: Physician Assistant

## 2013-11-21 VITALS — BP 104/62 | HR 86 | Temp 97.6°F | Resp 18 | Wt 124.0 lb

## 2013-11-21 DIAGNOSIS — R058 Other specified cough: Secondary | ICD-10-CM

## 2013-11-21 DIAGNOSIS — R05 Cough: Secondary | ICD-10-CM

## 2013-11-21 DIAGNOSIS — R059 Cough, unspecified: Secondary | ICD-10-CM

## 2013-11-21 MED ORDER — HYDROCOD POLST-CHLORPHEN POLST 10-8 MG/5ML PO LQCR: 5.0000 mL | Freq: Two times a day (BID) | ORAL | Status: DC | PRN

## 2013-11-21 MED ORDER — BENZONATATE 100 MG PO CAPS: 100.0000 mg | ORAL_CAPSULE | Freq: Three times a day (TID) | ORAL | Status: DC | PRN

## 2013-11-21 NOTE — Progress Notes (Signed)
   Subjective:    Patient ID: Sandra Olsen, female    DOB: [birth year], 23 y.o.   MRN: [id number]  HPI Patient presents to clinic for cough. Patient had a sore throat and nasal congestion that started 11/12/13 and resolved 11/15/13. Cough began 9/20 and has only gotten slightly better. Cough keeps patient awake at night and is sometimes productive. She has tried Mucinex, Delsym, and cough drops without relief. Her sick contacts include multiple coworkers. She has not had fever and PMH negative for asthma or allergies.   Review of Systems  Constitutional: Negative for fever, chills, diaphoresis and fatigue.  HENT: Positive for congestion. Negative for ear discharge, ear pain, facial swelling, postnasal drip, rhinorrhea, sinus pressure, sneezing and sore throat.   Eyes: Negative for discharge and itching.  Respiratory: Positive for cough (sometimes productive). Negative for chest tightness, shortness of breath and wheezing.   Cardiovascular: Negative for chest pain.  Gastrointestinal: Negative for nausea and vomiting.  Musculoskeletal: Negative for arthralgias and neck stiffness.  Skin: Negative.   Allergic/Immunologic: Negative for environmental allergies.  Neurological: Negative for dizziness, light-headedness and headaches.  Psychiatric/Behavioral: Positive for sleep disturbance (secondary to cough).       Objective:   Physical Exam  Constitutional: She is oriented to person, place, and time. She appears well-developed and well-nourished. No distress.  HENT:  Head: Normocephalic and atraumatic.  Right Ear: Tympanic membrane, external ear and ear canal normal.  Left Ear: Tympanic membrane, external ear and ear canal normal.  Nose: Mucosal edema (mild eythema) present. No rhinorrhea, sinus tenderness, nasal deformity or septal deviation. No epistaxis.  No foreign bodies.  Mouth/Throat: Oropharynx is clear and moist and mucous membranes are normal. No oral lesions. No uvula swelling. No  oropharyngeal exudate, posterior oropharyngeal edema or posterior oropharyngeal erythema.  Eyes: Conjunctivae are normal. Pupils are equal, round, and reactive to light. Right eye exhibits no discharge. Left eye exhibits no discharge. No scleral icterus.  Neck: Neck supple. No thyromegaly present.  Cardiovascular: Normal rate, regular rhythm and normal heart sounds.  Exam reveals no gallop and no friction rub.   No murmur heard. Pulmonary/Chest: Effort normal and breath sounds normal. She has no wheezes. She has no rales.  Abdominal: Soft. Bowel sounds are normal. She exhibits no distension. There is no tenderness.  Lymphadenopathy:    She has no cervical adenopathy.  Neurological: She is alert and oriented to person, place, and time.  Skin: Skin is warm and dry. No rash noted. No erythema.   Blood pressure 104/62, pulse 86, temperature 97.6 F (36.4 C), resp. rate 18, weight 124 lb (56.246 kg), last menstrual period 10/23/2013, SpO2 98.00%.       Assessment & Plan:   1. Post-viral cough syndrome Symptomatic care and post-tussive symptoms lasting up to two weeks discussed with patient. - benzonatate (TESSALON) 100 MG capsule; Take 1-2 capsules (100-200 mg total) by mouth 3 (three) times daily as needed for cough.  Dispense: 40 capsule; Refill: 0 - chlorpheniramine-HYDROcodone (TUSSIONEX PENNKINETIC ER) 10-8 MG/5ML LQCR; Take 5 mLs by mouth every 12 (twelve) hours as needed for cough (cough).  Dispense: 70 mL; Refill: 0

## 2013-11-21 NOTE — Progress Notes (Signed)
I was directly involved with the patient's care and agree with the physical, diagnosis and treatment plan.  

## 2013-11-26 HISTORY — PX: WISDOM TOOTH EXTRACTION: SHX21

## 2013-12-26 ENCOUNTER — Ambulatory Visit (INDEPENDENT_AMBULATORY_CARE_PROVIDER_SITE_OTHER): Payer: BC Managed Care – PPO | Admitting: Emergency Medicine

## 2013-12-26 VITALS — BP 108/66 | HR 70 | Temp 98.2°F | Resp 16 | Ht <= 58 in | Wt 123.4 lb

## 2013-12-26 DIAGNOSIS — R110 Nausea: Secondary | ICD-10-CM

## 2013-12-26 DIAGNOSIS — Z880 Allergy status to penicillin: Secondary | ICD-10-CM

## 2013-12-26 MED ORDER — ONDANSETRON 4 MG PO TBDP
8.0000 mg | ORAL_TABLET | Freq: Once | ORAL | Status: AC
  Administered 2013-12-26: 8 mg via ORAL

## 2013-12-26 MED ORDER — ONDANSETRON 8 MG PO TBDP: 8.0000 mg | ORAL_TABLET | Freq: Three times a day (TID) | ORAL | Status: DC | PRN

## 2013-12-26 NOTE — Progress Notes (Signed)
[facility] [address], [place] [id number] [phone]  Date:  12/26/2013   Name:  Melina SchoolsHji Carvell   DOB:  1990-12-31   MRN:  [id number]  PCP:  No primary provider on file.    Chief Complaint: Nausea   History of Present Illness:  Delane Melida QuitterRmah is a 23 y.o. very pleasant female patient who presents with the following:  Underwent wisdom tooth extraction 8 days ago.  Was taking amoxicillin, vicodin and decadron Has been off decadron and vicodin for several days  Stopped amoxicillin yesterday. Last night developed nausea but no vomiting.   Yesterday developed erythematous and pruritic rash on chest and face.  No difficulty swallowing or wheezing or shortness of breath No stool change No fever or chills.  Fatigued. No history of penicillin allergy No improvement with over the counter medications or other home remedies. Denies other complaint or health concern today.   Patient Active Problem List   Diagnosis Date Noted  . Rash and nonspecific skin eruption 06/02/2013    No past medical history on file.  Past Surgical History  Procedure Laterality Date  . Cesarean section    . Wisdom tooth extraction  11/2013    History  Substance Use Topics  . Smoking status: Never Smoker   . Smokeless tobacco: Never Used  . Alcohol Use: No    Family History  Problem Relation Age of Onset  . Diabetes Father     No Known Allergies  Medication list has been reviewed and updated.  No current outpatient prescriptions on file prior to visit.   No current facility-administered medications on file prior to visit.    Review of Systems:  As per HPI, otherwise negative.    Physical Examination: Filed Vitals:   12/26/13 1455  BP: 108/66  Pulse: 70  Temp: 98.2 F (36.8 C)  Resp: 16   Filed Vitals:   12/26/13 1455  Height: 4\' 10"  (1.473 m)  Weight: 123 lb 6.4 oz (55.974 kg)   Body mass index is 25.8 kg/(m^2). Ideal Body Weight: Weight in (lb) to have BMI  = 25: 119.4  GEN: WDWN, moderate distress, Non-toxic, A & O x 3 HEENT: Atraumatic, Normocephalic. Neck supple. No masses, No LAD. Ears and Nose: No external deformity. CV: RRR, No M/G/R. No JVD. No thrill. No extra heart sounds. PULM: CTA B, no wheezes, crackles, rhonchi. No retractions. No resp. distress. No accessory muscle use. ABD: S, NT, ND, +BS. No rebound. No HSM. EXTR: No c/c/e NEURO Normal gait.  PSYCH: Normally interactive. Conversant. Not depressed or anxious appearing.  Calm demeanor.    Assessment and Plan: Adverse reaction to amoxicillin Benadryl Nausea  zofran Clears Follow up as needed   Signed,  Phillips OdorJeffery Emanii Bugbee, MD

## 2013-12-26 NOTE — Patient Instructions (Signed)
Nausea, Adult Nausea is the feeling that you have an upset stomach or have to vomit. Nausea by itself is not likely a serious concern, but it may be an early sign of more serious medical problems. As nausea gets worse, it can lead to vomiting. If vomiting develops, there is the risk of dehydration.  CAUSES   Viral infections.  Food poisoning.  Medicines.  Pregnancy.  Motion sickness.  Migraine headaches.  Emotional distress.  Severe pain from any source.  Alcohol intoxication. HOME CARE INSTRUCTIONS  Get plenty of rest.  Ask your caregiver about specific rehydration instructions.  Eat small amounts of food and sip liquids more often.  Take all medicines as told by your caregiver. SEEK MEDICAL CARE IF:  You have not improved after 2 days, or you get worse.  You have a headache. SEEK IMMEDIATE MEDICAL CARE IF:   You have a fever.  You faint.  You keep vomiting or have blood in your vomit.  You are extremely weak or dehydrated.  You have dark or bloody stools.  You have severe chest or abdominal pain. MAKE SURE YOU:  Understand these instructions.  Will watch your condition.  Will get help right away if you are not doing well or get worse. Document Released: 03/22/2004 Document Revised: 11/07/2011 Document Reviewed: 10/25/2010 [facility] Patient Information 2015 [place], [place]. This information is not intended to replace advice given to you by your health care provider. Make sure you discuss any questions you have with your health care provider. Clear Liquid Diet A clear liquid diet is a short-term diet that is prescribed to provide the necessary fluid and basic energy you need when you can have nothing else. The clear liquid diet consists of liquids or solids that will become liquid at room temperature. You should be able to see through the liquid. There are many reasons that you may be restricted to clear liquids, such as:  When you have a sudden-onset  (acute) condition that occurs before or after surgery.  To help your body slowly get adjusted to food again after a long period when you were unable to have food.  Replacement of fluids when you have a diarrheal disease.  When you are going to have certain exams, such as a colonoscopy, in which instruments are inserted inside your body to look at parts of your digestive system. WHAT CAN I HAVE? A clear liquid diet does not provide all the nutrients you need. It is important to choose a variety of the following items to get as many nutrients as possible:  Vegetable juices that do not have pulp.  Fruit juices and fruit drinks that do not have pulp.  Coffee (regular or decaffeinated), tea, or soda at the discretion of your health care provider.  Clear bouillon, broth, or strained broth-based soups.  High-protein and flavored gelatins.  Sugar or honey.  Ices or frozen ice pops that do not contain milk. If you are not sure whether you can have certain items, you should ask your health care provider. You may also ask your health care provider if there are any other clear liquid options. Document Released: 02/12/2005 Document Revised: 02/17/2013 Document Reviewed: 01/09/2013 [facility] Patient Information 2015 [place]. This information is not intended to replace advice given to you by your health care provider. Make sure you discuss any questions you have with your health care provider.

## 2013-12-26 NOTE — Progress Notes (Deleted)
Chief Complaint:  Chief Complaint  Patient presents with  . Nausea    lightheaded and rasht on her face x 1 week    HPI: Sandra Olsen is a 23 y.o. female who is here for  ***  No past medical history on file. Past Surgical History  Procedure Laterality Date  . Cesarean section    . Wisdom tooth extraction  11/2013   History   Social History  . Marital Status: Married    Spouse Name: N/A    Number of Children: 1  . Years of Education: N/A   Occupational History  . Student- Medical Assistant     [employer]   Social History Main Topics  . Smoking status: Never Smoker   . Smokeless tobacco: Never Used  . Alcohol Use: No  . Drug Use: No  . Sexual Activity: Yes    Partners: Male    Birth Control/ Protection: Condom     Comment: "most of the time"   Other Topics Concern  . None   Social History Narrative   Originally from [place].  Came to the [place] at age 67 years.  Lives with her husband and their daughter.   Family History  Problem Relation Age of Onset  . Diabetes Father    No Known Allergies Prior to Admission medications   Not on File     ROS: The patient denies fevers, chills, night sweats, unintentional weight loss, chest pain, palpitations, wheezing, dyspnea on exertion, nausea, vomiting, abdominal pain, dysuria, hematuria, melena, numbness, weakness, or tingling. ***  All other systems have been reviewed and were otherwise negative with the exception of those mentioned in the HPI and as above.    PHYSICAL EXAM: Filed Vitals:   12/26/13 1455  BP: 108/66  Pulse: 70  Temp: 98.2 F (36.8 C)  Resp: 16   Filed Vitals:   12/26/13 1455  Height: 4\' 10"  (1.473 m)  Weight: 123 lb 6.4 oz (55.974 kg)   Body mass index is 25.8 kg/(m^2).  General: Alert, no acute distress HEENT:  Normocephalic, atraumatic, oropharynx patent. EOMI, PERRLA Cardiovascular:  Regular rate and rhythm, no rubs murmurs or gallops.  No Carotid bruits, radial pulse intact. No pedal  edema.  Respiratory: Clear to auscultation bilaterally.  No wheezes, rales, or rhonchi.  No cyanosis, no use of accessory musculature GI: No organomegaly, abdomen is soft and non-tender, positive bowel sounds.  No masses. Skin: No rashes. Neurologic: Facial musculature symmetric. Psychiatric: Patient is appropriate throughout our interaction. Lymphatic: No cervical lymphadenopathy Musculoskeletal: Gait intact.   LABS: Results for orders placed in visit on 11/02/12  URINE CULTURE      Result Value Ref Range   Culture ESCHERICHIA COLI     Colony Count 75,000 COLONIES/ML     Organism ID, Bacteria ESCHERICHIA COLI    POCT UA - MICROSCOPIC ONLY      Result Value Ref Range   WBC, Ur, HPF, POC tntc     RBC, urine, microscopic tntc     Bacteria, U Microscopic trace     Mucus, UA neg     Epithelial cells, urine per micros neg     Crystals, Ur, HPF, POC neg     Casts, Ur, LPF, POC renal tubular     Yeast, UA neg    POCT URINALYSIS DIPSTICK      Result Value Ref Range   Color, UA lt red     Clarity, UA cloudy  Glucose, UA neg     Bilirubin, UA neg     Ketones, UA neg     Spec Grav, UA 1.010     Blood, UA large     pH, UA 7.0     Protein, UA 30     Urobilinogen, UA 0.2     Nitrite, UA neg     Leukocytes, UA small (1+)       EKG/XRAY:   [facility]   ASSESSMENT/PLAN: No diagnosis found.   Gross sideeffects, risk and benefits, and alternatives of medications d/w patient. Patient is aware that all medications have potential sideeffects and we are unable to predict every sideeffect or drug-drug interaction that may occur.  Vasil Juhasz PHUONG, DO 12/26/2013 3:04 PM

## 2013-12-26 NOTE — Addendum Note (Signed)
Addended by: Tawnya CrookSAMBATH, Charlean Carneal on: 12/26/2013 03:25 PM   Modules accepted: Orders

## 2014-02-26 NOTE — L&D Delivery Note (Signed)
Delivery Note  First Stage: Labor onset: 0600 (12/8) Augmentation : none Analgesia /Anesthesia intrapartum: epidural AROM at 2252  Second Stage: Complete dilation at 0533 Onset of pushing at 0540 FHR second stage 150 with few variables - category 2  Delivery of a viable female at 0601 by CNM in LOA position no nuchal cord Cord double clamped after cessation of pulsation, cut by FOB - short cord noted Cord blood sample collected   Third Stage: [facility]a [facility] intact with 3 VC @ [phone] with short cord and battledore placenta noted Placenta disposition: hospital disposal Uterine tone firm / bleeding scant  no laceration identified  Est. Blood Loss (mL): 100  Complications: none  Mom to postpartum.  Baby to Couplet care / Skin to Skin.  Successful TOLAC with VBAC  Newborn: Birth Weight: 6 pounds 14.9 oz Apgar Scores: 9-9 Feeding planned: bottle with formula   Marlinda MikeBAILEY, Ely Ballen CNM, MSN, [facility] 02/04/2015, 6:19 AM

## 2015-02-03 ENCOUNTER — Encounter (HOSPITAL_COMMUNITY): Payer: Self-pay | Admitting: *Deleted

## 2015-02-03 ENCOUNTER — Inpatient Hospital Stay (HOSPITAL_COMMUNITY)
Admission: AD | Admit: 2015-02-03 | Discharge: 2015-02-06 | DRG: 775 | Disposition: A | Discharge status: Home / Self Care | Payer: 59 | Source: Ambulatory Visit | Attending: Obstetrics | Admitting: Obstetrics

## 2015-02-03 DIAGNOSIS — Z3A38 38 weeks gestation of pregnancy: Secondary | ICD-10-CM | POA: Diagnosis not present

## 2015-02-03 DIAGNOSIS — O99824 Streptococcus B carrier state complicating childbirth: Secondary | ICD-10-CM | POA: Diagnosis present

## 2015-02-03 DIAGNOSIS — O34219 Maternal care for unspecified type scar from previous cesarean delivery: Secondary | ICD-10-CM | POA: Diagnosis present

## 2015-02-03 DIAGNOSIS — Z833 Family history of diabetes mellitus: Secondary | ICD-10-CM

## 2015-02-03 DIAGNOSIS — B359 Dermatophytosis, unspecified: Secondary | ICD-10-CM | POA: Diagnosis present

## 2015-02-03 DIAGNOSIS — D509 Iron deficiency anemia, unspecified: Secondary | ICD-10-CM | POA: Diagnosis present

## 2015-02-03 DIAGNOSIS — O43193 Other malformation of placenta, third trimester: Secondary | ICD-10-CM | POA: Diagnosis present

## 2015-02-03 DIAGNOSIS — O9902 Anemia complicating childbirth: Secondary | ICD-10-CM | POA: Diagnosis present

## 2015-02-03 HISTORY — DX: Gestational (pregnancy-induced) hypertension without significant proteinuria, unspecified trimester: O13.9

## 2015-02-03 HISTORY — DX: Iron deficiency anemia, unspecified: D50.9

## 2015-02-03 LAB — CBC
HEMATOCRIT: 33 % — AB (ref 36.0–46.0)
HEMOGLOBIN: 10.2 g/dL — AB (ref 12.0–15.0)
MCH: 21 pg — ABNORMAL LOW (ref 26.0–34.0)
MCHC: 30.9 g/dL (ref 30.0–36.0)
MCV: 68 fL — AB (ref 78.0–100.0)
Platelets: 259 10*3/uL (ref 150–400)
RBC: 4.85 MIL/uL (ref 3.87–5.11)
RDW: 15 % (ref 11.5–15.5)
WBC: 8.5 10*3/uL (ref 4.0–10.5)

## 2015-02-03 LAB — COMPREHENSIVE METABOLIC PANEL
ALT: 17 U/L (ref 14–54)
AST: 21 U/L (ref 15–41)
Albumin: 2.9 g/dL — ABNORMAL LOW (ref 3.5–5.0)
Alkaline Phosphatase: 189 U/L — ABNORMAL HIGH (ref 38–126)
Anion gap: 9 (ref 5–15)
BUN: 12 mg/dL (ref 6–20)
CO2: 21 mmol/L — ABNORMAL LOW (ref 22–32)
Calcium: 8.9 mg/dL (ref 8.9–10.3)
Chloride: 106 mmol/L (ref 101–111)
Creatinine, Ser: 0.5 mg/dL (ref 0.44–1.00)
GFR calc Af Amer: >60 mL/min (ref >60)
GFR calc non Af Amer: >60 mL/min (ref >60)
Glucose, Bld: 83 mg/dL (ref 65–99)
Potassium: 4 mmol/L (ref 3.5–5.1)
Sodium: 136 mmol/L (ref 135–145)
Total Bilirubin: 0.5 mg/dL (ref 0.3–1.2)
Total Protein: 5.9 g/dL — ABNORMAL LOW (ref 6.5–8.1)

## 2015-02-03 LAB — TYPE AND SCREEN
ABO/RH(D): B POS
ANTIBODY SCREEN: NEGATIVE

## 2015-02-03 MED ORDER — LACTATED RINGERS IV SOLN: 500.0000 mL | INTRAVENOUS | Status: DC | PRN

## 2015-02-03 MED ORDER — OXYCODONE-ACETAMINOPHEN 5-325 MG PO TABS: 2.0000 | ORAL_TABLET | ORAL | Status: DC | PRN

## 2015-02-03 MED ORDER — PENICILLIN G POTASSIUM 5000000 UNITS IJ SOLR
5.0000 10*6.[IU] | Freq: Once | INTRAMUSCULAR | Status: AC
  Administered 2015-02-03: 5 10*6.[IU] via INTRAVENOUS
  Filled 2015-02-03: qty 5

## 2015-02-03 MED ORDER — PENICILLIN G POTASSIUM 5000000 UNITS IJ SOLR
2.5000 10*6.[IU] | INTRAVENOUS | Status: DC
  Administered 2015-02-04: 2.5 10*6.[IU] via INTRAVENOUS
  Filled 2015-02-03 (×4): qty 2.5

## 2015-02-03 MED ORDER — OXYTOCIN 40 UNITS IN LACTATED RINGERS INFUSION - SIMPLE MED
62.5000 mL/h | INTRAVENOUS | Status: DC
  Administered 2015-02-04: 999 mL/h via INTRAVENOUS
  Filled 2015-02-03: qty 1000

## 2015-02-03 MED ORDER — ONDANSETRON HCL 4 MG/2ML IJ SOLN
4.0000 mg | Freq: Four times a day (QID) | INTRAMUSCULAR | Status: DC | PRN
Start: 2015-02-03 — End: 2015-02-04

## 2015-02-03 MED ORDER — ACETAMINOPHEN 325 MG PO TABS: 650.0000 mg | ORAL_TABLET | ORAL | Status: DC | PRN

## 2015-02-03 MED ORDER — LACTATED RINGERS IV SOLN
INTRAVENOUS | Status: DC
  Administered 2015-02-03 – 2015-02-04 (×3): via INTRAVENOUS

## 2015-02-03 MED ORDER — LIDOCAINE HCL (PF) 1 % IJ SOLN
30.0000 mL | INTRAMUSCULAR | Status: DC | PRN
  Filled 2015-02-03: qty 30

## 2015-02-03 MED ORDER — OXYCODONE-ACETAMINOPHEN 5-325 MG PO TABS: 1.0000 | ORAL_TABLET | ORAL | Status: DC | PRN

## 2015-02-03 MED ORDER — CITRIC ACID-SODIUM CITRATE 334-500 MG/5ML PO SOLN: 30.0000 mL | ORAL | Status: DC | PRN

## 2015-02-03 MED ORDER — FENTANYL CITRATE (PF) 100 MCG/2ML IJ SOLN
100.0000 ug | INTRAMUSCULAR | Status: DC | PRN
  Administered 2015-02-03: 100 ug via INTRAVENOUS
  Filled 2015-02-03: qty 2

## 2015-02-03 MED ORDER — OXYTOCIN BOLUS FROM INFUSION: 500.0000 mL | INTRAVENOUS | Status: DC

## 2015-02-03 MED ORDER — FLEET ENEMA 7-19 GM/118ML RE ENEM: 1.0000 | ENEMA | RECTAL | Status: DC | PRN

## 2015-02-03 NOTE — MAU Note (Signed)
Pt reports contractions q 5 minutes, denies bleeding or ROM.  

## 2015-02-03 NOTE — H&P (Signed)
  OB ADMISSION/ HISTORY & PHYSICAL:  Admission Date: 02/03/2015  7:28 PM  Admit Diagnosis: 38.4 weeks / active labor / previous cesarean section for TOLAC  Sandra Olsen is a 24 y.o. female presenting for onset of labor.  Prenatal History: G2P1000   EDC : 02/13/2015, Date entered prior to episode creation  [facility]  Primary Ob Provider: Fredric MareBailey CNM Prenatal course complicated by previous CS - arrest of labor at 4cm (IOL for PEC @ 39 weeks) / hx pre-eclampsia / IDA anemia / tinea and fungal skin rash managed by dermatology / ASB   Prenatal Labs: ABO, Rh:  B positive Antibody:  Negative Rubella:   Immune RPR:   NR HBsAg:   Negative HIV:   NR GTT: Normal GBS:   POSITIVE  PIH labs this week normal  Medical / Surgical History :  Past medical history: History reviewed. No pertinent past medical history.   Past surgical history:  Past Surgical History  Procedure Laterality Date  . Cesarean section    . Wisdom tooth extraction  11/2013   Family History:  Family History  Problem Relation Age of Onset  . Diabetes Father     Social History:  reports that she has never smoked. She has never used smokeless tobacco. She reports that she does not drink alcohol or use illicit drugs.  Allergies: Review of patient's allergies indicates no known allergies.   Current Medications at time of admission:  Prior to Admission medications   Medication Sig Start Date End Date Taking? Authorizing Provider  calcium carbonate (TUMS - DOSED IN MG ELEMENTAL CALCIUM) 500 MG chewable tablet Chew 1 tablet by mouth 2 (two) times daily as needed for indigestion or heartburn.   Yes Historical Provider, MD  Ciclopirox 1 % shampoo Apply 1 application topically every other day. 12/26/14  Yes Historical Provider, MD  clotrimazole (LOTRIMIN) 1 % cream Apply 1 application topically daily as needed (For itching.).   Yes Historical Provider, MD  POLY-IRON 150 FORTE 150-25-1  MG-MCG-MG CAPS Take 1 capsule by mouth daily. 11/26/14  Yes Historical Provider, MD   Review of Systems: Active FM onset of ctx @ 0600 this am currently every 5 minutes No LOF bloody show + with lots of mucus discharge all day  Physical Exam:  VS: Blood pressure 136/89, pulse 96, temperature 98.2 F (36.8 C), temperature source Oral, resp. rate 18, height 4\' 11"  (1.499 m), weight 68.493 kg (151 lb), SpO2 98 %.  General: alert and oriented, appears uncomfortable with ctx Heart: RRR Lungs: Clear lung fields Abdomen: Gravid, soft and non-tender, non-distended / uterus: gravid and non-tender Extremities: 2+ pedal edema  Genitalia / VE: Dilation: 6 Effacement (%): 90 Station: 0 Exam by:: Sandra Olsen, CNM  FHR: baseline rate 155 / variability moderate / accelerations + / no decelerations TOCO: Q 3 minutes  Assessment: 38.[redacted] weeks gestation active stage of labor for TOLAC FHR category 1 + GBS (ASB) IDA of pregnancy  Plan:  Admit Expectant management PCN per protocol IV analgesia Monitor for PEC development thru PP  Dr Ernestina PennaFogleman notified of admission / plan of care   Sandra MikeBAILEY, Sandra Olsen CNM, MSN, [facility] 02/03/2015, 10:57 PM

## 2015-02-03 NOTE — Progress Notes (Signed)
S:  Painful ctx       IV meds helpful  O:  VS: Blood pressure 136/89, pulse 96, temperature 98.2 F (36.8 C), temperature source Oral, resp. rate 18, height 4\' 11"  (1.499 m), weight 68.493 kg (151 lb), SpO2 98 %.        FHR : baseline 155 / variability moderate / accelerations + / no decelerations        Toco: contractions every 3 minutes / 60-80 sec / strong        Cervix : 6cm / 90% / vtx / 0 station / ROT        Membranes: BBOW - AROM with thin green MSF - c/w fresh meconium        PCN for GBS infusing  A: active labor - TOLAC     FHR category 1  P: expectant management      CNM for labor support   Marlinda MikeBAILEY, Lusine Corlett CNM, MSN, [facility] 02/03/2015, 11:04 PM

## 2015-02-03 NOTE — Progress Notes (Signed)
Notified of pt arrival in MAU and exam. Will let pt walk for 1.5 hours and recheck. Will call back after recheck

## 2015-02-04 ENCOUNTER — Encounter (HOSPITAL_COMMUNITY): Payer: Self-pay | Admitting: *Deleted

## 2015-02-04 ENCOUNTER — Inpatient Hospital Stay (HOSPITAL_COMMUNITY): Payer: 59 | Admitting: Anesthesiology

## 2015-02-04 DIAGNOSIS — O34219 Maternal care for unspecified type scar from previous cesarean delivery: Secondary | ICD-10-CM | POA: Diagnosis not present

## 2015-02-04 LAB — RPR: RPR Ser Ql: NONREACTIVE

## 2015-02-04 LAB — ABO/RH: ABO/RH(D): B POS

## 2015-02-04 MED ORDER — DIPHENHYDRAMINE HCL 50 MG/ML IJ SOLN: 12.5000 mg | INTRAMUSCULAR | Status: DC | PRN

## 2015-02-04 MED ORDER — MAGNESIUM OXIDE 400 (241.3 MG) MG PO TABS
400.0000 mg | ORAL_TABLET | Freq: Every day | ORAL | Status: DC
  Administered 2015-02-04 – 2015-02-06 (×3): 400 mg via ORAL
  Filled 2015-02-04 (×3): qty 1

## 2015-02-04 MED ORDER — BENZOCAINE-MENTHOL 20-0.5 % EX AERO
1.0000 "application " | INHALATION_SPRAY | CUTANEOUS | Status: DC | PRN
  Administered 2015-02-04: 1 via TOPICAL
  Filled 2015-02-04: qty 56

## 2015-02-04 MED ORDER — OXYCODONE-ACETAMINOPHEN 5-325 MG PO TABS: 1.0000 | ORAL_TABLET | ORAL | Status: DC | PRN

## 2015-02-04 MED ORDER — PHENYLEPHRINE 40 MCG/ML (10ML) SYRINGE FOR IV PUSH (FOR BLOOD PRESSURE SUPPORT)
80.0000 ug | PREFILLED_SYRINGE | INTRAVENOUS | Status: DC | PRN
  Filled 2015-02-04: qty 20
  Filled 2015-02-04: qty 2

## 2015-02-04 MED ORDER — ACETAMINOPHEN 325 MG PO TABS
650.0000 mg | ORAL_TABLET | ORAL | Status: DC | PRN
Start: 2015-02-04 — End: 2015-02-06
  Administered 2015-02-04 – 2015-02-05 (×2): 650 mg via ORAL
  Filled 2015-02-04 (×2): qty 2

## 2015-02-04 MED ORDER — EPHEDRINE 5 MG/ML INJ
10.0000 mg | INTRAVENOUS | Status: DC | PRN
  Filled 2015-02-04: qty 2

## 2015-02-04 MED ORDER — FENTANYL 2.5 MCG/ML BUPIVACAINE 1/10 % EPIDURAL INFUSION (WH - ANES)
14.0000 mL/h | INTRAMUSCULAR | Status: DC | PRN
  Administered 2015-02-04: 12 mL/h via EPIDURAL
  Filled 2015-02-04: qty 125

## 2015-02-04 MED ORDER — HYDROCHLOROTHIAZIDE 12.5 MG PO CAPS
25.0000 mg | ORAL_CAPSULE | Freq: Every day | ORAL | Status: DC
  Filled 2015-02-04: qty 2

## 2015-02-04 MED ORDER — WITCH HAZEL-GLYCERIN EX PADS: 1.0000 "application " | MEDICATED_PAD | CUTANEOUS | Status: DC | PRN

## 2015-02-04 MED ORDER — OXYCODONE-ACETAMINOPHEN 5-325 MG PO TABS: 2.0000 | ORAL_TABLET | ORAL | Status: DC | PRN

## 2015-02-04 MED ORDER — LIDOCAINE HCL (PF) 1 % IJ SOLN
INTRAMUSCULAR | Status: DC | PRN
  Administered 2015-02-04: 5 mL via EPIDURAL
  Administered 2015-02-04: 2 mL via EPIDURAL
  Administered 2015-02-04: 3 mL via EPIDURAL

## 2015-02-04 MED ORDER — OXYTOCIN 10 UNIT/ML IJ SOLN
10.0000 [IU] | Freq: Once | INTRAMUSCULAR | Status: AC
  Administered 2015-02-04: 10 [IU] via INTRAMUSCULAR
  Filled 2015-02-04: qty 1

## 2015-02-04 MED ORDER — SENNOSIDES-DOCUSATE SODIUM 8.6-50 MG PO TABS
2.0000 | ORAL_TABLET | ORAL | Status: DC
  Administered 2015-02-05 (×2): 2 via ORAL
  Filled 2015-02-04 (×2): qty 2

## 2015-02-04 MED ORDER — HYDROCHLOROTHIAZIDE 25 MG PO TABS
25.0000 mg | ORAL_TABLET | Freq: Every day | ORAL | Status: DC
  Administered 2015-02-04 – 2015-02-06 (×3): 25 mg via ORAL
  Filled 2015-02-04 (×3): qty 1

## 2015-02-04 MED ORDER — IBUPROFEN 600 MG PO TABS
600.0000 mg | ORAL_TABLET | Freq: Four times a day (QID) | ORAL | Status: DC
  Administered 2015-02-04 – 2015-02-05 (×4): 600 mg via ORAL
  Filled 2015-02-04 (×4): qty 1

## 2015-02-04 MED ORDER — DIBUCAINE 1 % RE OINT: 1.0000 "application " | TOPICAL_OINTMENT | RECTAL | Status: DC | PRN

## 2015-02-04 MED ORDER — IRON POLYSACCH CMPLX-B12-FA 150-0.025-1 MG PO CAPS: 1.0000 | ORAL_CAPSULE | Freq: Every day | ORAL | Status: DC

## 2015-02-04 MED ORDER — POLYSACCHARIDE IRON COMPLEX 150 MG PO CAPS
150.0000 mg | ORAL_CAPSULE | Freq: Every day | ORAL | Status: DC
  Administered 2015-02-04 – 2015-02-06 (×3): 150 mg via ORAL
  Filled 2015-02-04 (×3): qty 1

## 2015-02-04 MED ORDER — DIPHENHYDRAMINE HCL 25 MG PO CAPS: 25.0000 mg | ORAL_CAPSULE | Freq: Four times a day (QID) | ORAL | Status: DC | PRN

## 2015-02-04 NOTE — Progress Notes (Signed)
S:  Resting between ctx / painful ctx & moaning with ctx       Up to bathroom to void with minimal assist  O:  VS: Blood pressure 138/83, pulse 79, temperature 97.6 F (36.4 C), temperature source Oral, resp. rate 16, height 4\' 11"  (1.499 m), weight 68.493 kg (151 lb), SpO2 98 %.         PIH labs normal        FHR : baseline 140 / variability moderate / accelerations + / no decelerations        Toco: contractions every 3-5 minutes / moderate to strong         Cervix : 8cm / 90% / vtx 0 station asynclitic responsive to manual flexion        Membranes: green thin moderate amount leakage with manual repositioning vtx  A: active labor     FHR category 1  P: reposition far lateral with pillow/peanut positioning      Recheck 1-2 hours per status  Marlinda MikeBAILEY, Burl Tauzin CNM, MSN, FACNM 02/04/2015, 12:24 AM

## 2015-02-04 NOTE — Anesthesia Preprocedure Evaluation (Addendum)
Anesthesia Evaluation  Patient identified by MRN, date of birth, ID band Patient awake    Reviewed: Allergy & Precautions, NPO status , Patient's Chart, lab work & pertinent test results  History of Anesthesia Complications Negative for: history of anesthetic complications  Airway Mallampati: II  TM Distance: >3 FB Neck ROM: Full    Dental  (+) Teeth Intact, Dental Advisory Given   Pulmonary neg pulmonary ROS,    Pulmonary exam normal breath sounds clear to auscultation       Cardiovascular Exercise Tolerance: Good hypertension (PIH), Normal cardiovascular exam Rhythm:Regular Rate:Normal     Neuro/Psych negative neurological ROS  negative psych ROS   GI/Hepatic negative GI ROS, Neg liver ROS,   Endo/Other  Obesity   Renal/GU negative Renal ROS     Musculoskeletal negative musculoskeletal ROS (+)   Abdominal   Peds  Hematology  (+) Blood dyscrasia, anemia , Plt 259k    Anesthesia Other Findings Day of surgery medications reviewed with the patient.  Reproductive/Obstetrics (+) Pregnancy                            Anesthesia Physical Anesthesia Plan  ASA: III  Anesthesia Plan: Epidural   Post-op Pain Management:    Induction:   Airway Management Planned:   Additional Equipment:   Intra-op Plan:   Post-operative Plan:   Informed Consent: I have reviewed the patients History and Physical, chart, labs and discussed the procedure including the risks, benefits and alternatives for the proposed anesthesia with the patient or authorized representative who has indicated his/her understanding and acceptance.   Dental advisory given  Plan Discussed with:   Anesthesia Plan Comments: (Patient identified. Risks/Benefits/Options discussed with patient including but not limited to bleeding, infection, nerve damage, paralysis, failed block, incomplete pain control, headache, blood  pressure changes, nausea, vomiting, reactions to medication both or allergic, itching and postpartum back pain. Confirmed with bedside nurse the patient's most recent platelet count. Confirmed with patient that they are not currently taking any anticoagulation, have any bleeding history or any family history of bleeding disorders. Patient expressed understanding and wished to proceed. All questions were answered. )        Anesthesia Quick Evaluation

## 2015-02-04 NOTE — Anesthesia Procedure Notes (Signed)

## 2015-02-04 NOTE — Progress Notes (Signed)
S:  Ctx stronger and more rectal pressure        Considering epidural  O:  VS: Blood pressure 121/79, pulse 72, temperature 97.7 F (36.5 C), temperature source Oral, resp. rate 16, height 4\' 11"  (1.499 m), weight 68.493 kg (151 lb), SpO2 98 %.        FHR : baseline 140 / variability moderate / accelerations + / few variable decelerations        Toco: contractions every 2-4 minutes / strong         Cervix : 9cm / 95% / vtx +1        Membranes: thin green fluid with show  A: active labor     FHR category 2  P: ok for epidural at maternal request      Anticipate SVB in next 2-4 hours   Sandra Olsen, Sandra Olsen CNM, MSN, [facility] 02/04/2015, 3:54 AM

## 2015-02-04 NOTE — Anesthesia Postprocedure Evaluation (Signed)
Anesthesia Post Note  Patient: Samie Bedel  Procedure(s) Performed: * No procedures listed *  Patient location during evaluation: Mother Baby Anesthesia Type: Epidural Level of consciousness: awake and alert and oriented Pain management: satisfactory to patient Vital Signs Assessment: post-procedure vital signs reviewed and stable Respiratory status: respiratory function stable and spontaneous breathing Cardiovascular status: stable Postop Assessment: No headache, No backache, Patient able to bend at knees, No signs of nausea or vomiting, no headache, no backache, patient able to bend at knees and Adequate PO intake (No residual numbness) Anesthetic complications: no    Last Vitals:  Filed Vitals:   02/04/15 1430 02/04/15 1702  BP: 127/68 129/88  Pulse: 75 86  Temp: 36.9 C 36.8 C  Resp: 18 18    Last Pain:  Filed Vitals:   02/04/15 1703  PainSc: 0-No pain                 Jordayn Mink

## 2015-02-04 NOTE — Progress Notes (Signed)
S:  Lots of pressure with ctx      Questioning more pain medicine now  O:  VS: Blood pressure 137/76, pulse 77, temperature 97.5 F (36.4 C), temperature source Oral, resp. rate 17, height 4\' 11"  (1.499 m), weight 68.493 kg (151 lb), SpO2 98 %.        FHR : baseline 140 / variability moderate / accelerations + / variable decelerations        Toco: contractions every 3-4 minutes / 90 / strong        Cervix : 8 / 90% / vtx +1 / well-applied to cervix        Membranes: thin green fluid / + small show  A: active labor     FHR category 2  P: expectant management      Discussed IV analgesia versus epidural options at this point of labor       Declines any analgesia or epidural at this time - aware can request at anytime as available per request    Marlinda MikeBAILEY, Markisha Meding CNM, MSN, [facility] 02/04/2015, 1:39 AM

## 2015-02-05 LAB — CBC
HCT: 29.5 % — ABNORMAL LOW (ref 36.0–46.0)
Hemoglobin: 9.2 g/dL — ABNORMAL LOW (ref 12.0–15.0)
MCH: 21.3 pg — ABNORMAL LOW (ref 26.0–34.0)
MCHC: 31.2 g/dL (ref 30.0–36.0)
MCV: 68.4 fL — ABNORMAL LOW (ref 78.0–100.0)
Platelets: 238 K/uL (ref 150–400)
RBC: 4.31 MIL/uL (ref 3.87–5.11)
RDW: 15.1 % (ref 11.5–15.5)
WBC: 12.3 K/uL — ABNORMAL HIGH (ref 4.0–10.5)

## 2015-02-05 LAB — COMPREHENSIVE METABOLIC PANEL
ALT: 17 U/L (ref 14–54)
AST: 25 U/L (ref 15–41)
Albumin: 2.6 g/dL — ABNORMAL LOW (ref 3.5–5.0)
Alkaline Phosphatase: 136 U/L — ABNORMAL HIGH (ref 38–126)
Anion gap: 5 (ref 5–15)
BUN: 16 mg/dL (ref 6–20)
CO2: 24 mmol/L (ref 22–32)
Calcium: 8.3 mg/dL — ABNORMAL LOW (ref 8.9–10.3)
Chloride: 106 mmol/L (ref 101–111)
Creatinine, Ser: 0.59 mg/dL (ref 0.44–1.00)
GFR calc Af Amer: >60 mL/min (ref >60)
GFR calc non Af Amer: >60 mL/min (ref >60)
Glucose, Bld: 75 mg/dL (ref 65–99)
Potassium: 3.8 mmol/L (ref 3.5–5.1)
Sodium: 135 mmol/L (ref 135–145)
Total Bilirubin: 0.4 mg/dL (ref 0.3–1.2)
Total Protein: 5.4 g/dL — ABNORMAL LOW (ref 6.5–8.1)

## 2015-02-05 LAB — URIC ACID: Uric Acid, Serum: 5.2 mg/dL (ref 2.3–6.6)

## 2015-02-05 MED ORDER — IBUPROFEN 800 MG PO TABS
800.0000 mg | ORAL_TABLET | Freq: Three times a day (TID) | ORAL | Status: DC
  Administered 2015-02-05 – 2015-02-06 (×3): 800 mg via ORAL
  Filled 2015-02-05 (×3): qty 1

## 2015-02-05 NOTE — Progress Notes (Signed)
PPD 1 SVD  S:  Reports feeling well - some cramping and soreness in bottom / no PIH symtoms             Tolerating po/ No nausea or vomiting             Bleeding is light             Pain controlled with motrin and percocet             Up ad lib / ambulatory / voiding QS  Newborn bottle (formula) feeding  / female O:               VS: BP 123/82 mmHg  Pulse 98  Temp(Src) 98.4 F (36.9 C) (Oral)  Resp 18  Ht 4\' 11"  (1.499 m)  Wt 68.493 kg (151 lb)  BMI 30.48 kg/m2  SpO2 99%  Breastfeeding? Unknown   LABS:              Recent Labs  02/03/15 2210 02/05/15 0545  WBC 8.5 12.3*  HGB 10.2* 9.2*  PLT 259 238               Blood type: --/--/B POS, B POS (12/08 2210)  Rubella:   Immune                    I&O: not done per orders              Physical Exam:             Alert and oriented X3  Lungs: Clear and unlabored  Heart: regular rate and rhythm / no mumurs  Abdomen: soft, non-tender, non-distended              Fundus: firm, non-tender, U-2  Perineum: no edema / intact  Lochia: light  Extremities: 2+ pedal edema, no calf pain or tenderness    A: PPD # 1             IDA of pregnancy             Dependent edema with HX PEC   Doing well - stable status  P: Routine post partum orders  HCTZ 25 Q day x 1 week - BP recheck in office end of next week             Continue prenatal, iron, magnesium             Bra continuously x 1-2 weeks / engorgement comfort measures reviewed             Increase motrin dose for comfort  Marlinda MikeBAILEY, Jonell Krontz CNM, MSN, FACNM 02/05/2015, 9:39 AM

## 2015-02-06 MED ORDER — HYDROCHLOROTHIAZIDE 25 MG PO TABS: 25.0000 mg | ORAL_TABLET | Freq: Every day | ORAL | Status: DC

## 2015-02-06 MED ORDER — IBUPROFEN 800 MG PO TABS: 800.0000 mg | ORAL_TABLET | Freq: Three times a day (TID) | ORAL | Status: DC

## 2015-02-06 MED ORDER — MAGNESIUM OXIDE 400 (241.3 MG) MG PO TABS: 400.0000 mg | ORAL_TABLET | Freq: Every day | ORAL | Status: DC

## 2015-02-06 NOTE — Progress Notes (Signed)
PPD 2 SVD  S:  Reports feeling well - ready to go home  / denies PIH symptoms             Tolerating po/ No nausea or vomiting             Bleeding is light             Pain controlled with motrin with perococet sometimes             Up ad lib / ambulatory / voiding QS  Newborn bottle - formaula feeding  O:               VS: BP 133/85 mmHg  Pulse 64  Temp(Src) 98.4 F (36.9 C) (Axillary)  Resp 19  Ht 4\' 11"  (1.499 m)  Wt 68.493 kg (151 lb)  BMI 30.48 kg/m2  SpO2 99%  Breastfeeding? Unknown   LABS:              Recent Labs  02/03/15 2210 02/05/15 0545  WBC 8.5 12.3*  HGB 10.2* 9.2*  PLT 259 238                I&O: negative 410              Physical Exam:             Alert and oriented X3  Abdomen: soft, non-tender, non-distended              Fundus: firm, non-tender, U-1  Perineum: no edema / intact  Lochia: light  Extremities: 1+ edema, no calf pain or tenderness  A: PPD # 2              IDA of pregnancy              Dependent edema with hx PEC  Doing well - stable status  P: Routine post partum orders  DC home - WOB booklet - BP and weight check in 3 days at [facility]  Sandra Olsen, Sandra Olsen CNM, MSN, [facility] 02/06/2015, 9:32 AM

## 2015-02-06 NOTE — Discharge Summary (Signed)
Obstetric Discharge Summary  Reason for Admission: onset of labor Prenatal Procedures: none Intrapartum Procedures: spontaneous vaginal delivery, GBS prophylaxis and epidural Postpartum Procedures: none Complications-Operative and Postpartum: none HEMOGLOBIN  Date Value Ref Range Status  02/05/2015 9.2* 12.0 - 15.0 g/dL Final  16/10/960408/04/2011 54.012.2 12.2 - 16.2 g/dL Final   HCT  Date Value Ref Range Status  02/05/2015 29.5* 36.0 - 46.0 % Final   HCT, POC  Date Value Ref Range Status  09/29/2011 40.7 37.7 - 47.9 % Final    Physical Exam:  General: alert, cooperative and no distress Lochia: appropriate Uterine Fundus: firm / Perineum intact DVT Evaluation: No evidence of DVT seen on physical exam.  Discharge Diagnoses: Term Pregnancy-delivered / VBAC  Discharge Information: Date: 02/06/2015 Activity: pelvic rest Diet: routine Medications: PNV, Ibuprofen, Iron and HCTZ and magnesium Condition: stable Instructions: refer to practice specific booklet Discharge to: home Follow-up Information    Follow up with Marlinda MikeBAILEY, Kimo Bancroft, CNM. Schedule an appointment as soon as possible for a visit on 02/09/2015.   Specialty:  Obstetrics and Gynecology   Contact information:   [address] [place] [id number] [phone]       Newborn Data: Live born female  Birth Weight: 6 lb 14.9 oz (3144 g) APGAR: 9, 9  Home with mother.  Marlinda MikeBAILEY, Adesuwa Osgood 02/06/2015, 11:20 AM

## 2015-02-10 NOTE — Addendum Note (Signed)
Addendum  created 02/10/15 0919 by Cecile HearingStephen Edward Jodeci Roarty, MD   Modules edited: Anesthesia Responsible Staff

## 2015-06-17 ENCOUNTER — Telehealth: Payer: Self-pay | Admitting: Hematology and Oncology

## 2015-06-17 ENCOUNTER — Encounter: Payer: Self-pay | Admitting: Hematology and Oncology

## 2015-06-17 NOTE — Telephone Encounter (Signed)
Faxed np letter to referring office to contact pt with appt. M[facility] np packet [facility]

## 2015-07-08 ENCOUNTER — Encounter: Payer: Self-pay | Admitting: Hematology and Oncology

## 2015-07-08 ENCOUNTER — Other Ambulatory Visit (HOSPITAL_BASED_OUTPATIENT_CLINIC_OR_DEPARTMENT_OTHER): Payer: 59

## 2015-07-08 ENCOUNTER — Ambulatory Visit (HOSPITAL_BASED_OUTPATIENT_CLINIC_OR_DEPARTMENT_OTHER): Payer: 59 | Admitting: Hematology and Oncology

## 2015-07-08 ENCOUNTER — Telehealth: Payer: Self-pay | Admitting: Hematology and Oncology

## 2015-07-08 VITALS — BP 121/76 | HR 75 | Temp 98.2°F | Resp 18 | Ht 59.0 in | Wt 109.3 lb

## 2015-07-08 DIAGNOSIS — R718 Other abnormality of red blood cells: Secondary | ICD-10-CM | POA: Diagnosis not present

## 2015-07-08 DIAGNOSIS — D509 Iron deficiency anemia, unspecified: Secondary | ICD-10-CM

## 2015-07-08 DIAGNOSIS — Z832 Family history of diseases of the blood and blood-forming organs and certain disorders involving the immune mechanism: Secondary | ICD-10-CM | POA: Diagnosis not present

## 2015-07-08 HISTORY — DX: Iron deficiency anemia, unspecified: D50.9

## 2015-07-08 NOTE — Assessment & Plan Note (Signed)
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 One of her daughters were tested and she was informed that her daughter has thalassemia We discussed the risk and benefit of testing. The patient once to no requested hemoglobinopathy evaluation. I will order that today and call her with test results.

## 2015-07-08 NOTE — Telephone Encounter (Signed)
Gave pt avs °

## 2015-07-08 NOTE — Progress Notes (Signed)
[facility] CONSULT NOTE  Patient Care Team: No Pcp Per Patient as PCP - General (General Practice)  CHIEF COMPLAINTS/PURPOSE OF CONSULTATION:  Microcytosis  HISTORY OF PRESENTING ILLNESS:  Sandra Olsen 25 y.o. female is here because of history of iron deficiency anemia and microcytosis.  She was found to have abnormal CBC from routine blood testing. CBC range from hemoglobin of 9 to normal at 12.2. She has chronic microcytosis with low MCV She denies recent chest pain on exertion, shortness of breath on minimal exertion, pre-syncopal episodes, or palpitations. She had not noticed any recent bleeding such as epistaxis, hematuria or hematochezia The patient denies over the counter NSAID ingestion. She is not on antiplatelets agents.  She had no prior history or diagnosis of cancer. Her age appropriate screening programs are up-to-date. She denies any pica and eats a variety of diet. She never donated blood or received blood transfusion The patient was prescribed oral iron supplements for a while for iron deficiency anemia. One of her daughters tested positive for thalassemia. The patient is from [place] origin  MEDICAL HISTORY:  Past Medical History  Diagnosis Date  . Pregnancy induced hypertension   . IDA (iron deficiency anemia) 02/03/2015  . Microcytic anemia 07/08/2015    SURGICAL HISTORY: Past Surgical History  Procedure Laterality Date  . Cesarean section    . Wisdom tooth extraction  11/2013    SOCIAL HISTORY: Social History   Social History  . Marital Status: Married    Spouse Name: N/A  . Number of Children: 1  . Years of Education: N/A   Occupational History  . Student- Medical Assistant     [employer]   Social History Main Topics  . Smoking status: Never Smoker   . Smokeless tobacco: Never Used  . Alcohol Use: No  . Drug Use: No  . Sexual Activity:    Partners: Male    Birth Control/ Protection: Condom     Comment: "most of the time"    Other Topics Concern  . Not on file   Social History Narrative   Originally from [place].  Came to the [place] at age 58 years.  Lives with her husband and their daughter.    FAMILY HISTORY: Family History  Problem Relation Age of Onset  . Diabetes Father   . Diabetes Mother     ALLERGIES:  has No Known Allergies.  MEDICATIONS:  Current Outpatient Prescriptions  Medication Sig Dispense Refill  . DIALYVITE VITAMIN D3 MAX 16109 units TABS Take 50,000 Units by mouth once a week.  0   No current facility-administered medications for this visit.    REVIEW OF SYSTEMS:   Constitutional: Denies fevers, chills or abnormal night sweats Eyes: Denies blurriness of vision, double vision or watery eyes Ears, nose, mouth, throat, and face: Denies mucositis or sore throat Respiratory: Denies cough, dyspnea or wheezes Cardiovascular: Denies palpitation, chest discomfort or lower extremity swelling Gastrointestinal:  Denies nausea, heartburn or change in bowel habits Skin: Denies abnormal skin rashes Lymphatics: Denies new lymphadenopathy or easy bruising Neurological:Denies numbness, tingling or new weaknesses Behavioral/Psych: Mood is stable, no new changes  All other systems were reviewed with the patient and are negative.  PHYSICAL EXAMINATION: ECOG PERFORMANCE STATUS: 0 - Asymptomatic  Filed Vitals:   07/08/15 1357  BP: 121/76  Pulse: 75  Temp: 98.2 F (36.8 C)  Resp: 18   Filed Weights   07/08/15 1357  Weight: 109 lb 4.8 oz (49.578 kg)    GENERAL:alert,  no distress and comfortable SKIN: skin color, texture, turgor are normal, no rashes or significant lesions EYES: normal, conjunctiva are pink and non-injected, sclera clear OROPHARYNX:no exudate, no erythema and lips, buccal mucosa, and tongue normal  NECK: supple, thyroid normal size, non-tender, without nodularity LYMPH:  no palpable lymphadenopathy in the cervical, axillary or inguinal LUNGS: clear to auscultation and  percussion with normal breathing effort HEART: regular rate & rhythm and no murmurs and no lower extremity edema ABDOMEN:abdomen soft, non-tender and normal bowel sounds Musculoskeletal:no cyanosis of digits and no clubbing  PSYCH: alert & oriented x 3 with fluent speech NEURO: no focal motor/sensory deficits  ASSESSMENT & PLAN:   Microcytosis The patient has microcytosis, 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 One of her daughters were tested and she was informed that her daughter has thalassemia We discussed the risk and benefit of testing. The patient once to no requested hemoglobinopathy evaluation. I will order that today and call her with test results.   Orders Placed This Encounter  Procedures  . Hemoglobinopathy evaluation    Standing Status: Future     Number of Occurrences: 1     Standing Expiration Date: 08/11/2016    All questions were answered. The patient knows to call the clinic with any problems, questions or concerns. I spent 30 minutes counseling the patient face to face. The total time spent in the appointment was 30 minutes and more than 50% was on counseling.     [facility] TransplantGORSUCH, Kevron Patella, MD 07/08/2015 2:21 PM

## 2015-07-13 LAB — HEMOGLOBINOPATHY EVALUATION
HEMOGLOBIN A2 QUANTITATION: 2.1 % (ref 0.7–3.1)
HGB C: 0 %
HGB S: 0 %
Hemoglobin F Quantitation: 0 % (ref 0.0–2.0)
Hgb A: 97.9 % (ref 94.0–98.0)

## 2015-07-18 ENCOUNTER — Telehealth: Payer: Self-pay | Admitting: Hematology and Oncology

## 2015-07-18 NOTE — Telephone Encounter (Signed)
I reviewed the results of hemoglobin electrophoresis with the patient. The result came back normal. 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 We discussed genetic inheritance and medical implication. 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 She appreciated the phone call and wants me to direct results to Marlinda Mikeanya Bailey.

## 2015-08-02 ENCOUNTER — Telehealth: Payer: Self-pay | Admitting: *Deleted

## 2015-08-02 NOTE — Telephone Encounter (Signed)
Pt requests her lab results be faxed to OB/GYN  Sandra Olsen.   Results faxed to fax#[phone] per pt's request.

## 2016-06-21 ENCOUNTER — Other Ambulatory Visit: Payer: Self-pay | Admitting: Obstetrics and Gynecology

## 2016-06-21 DIAGNOSIS — E049 Nontoxic goiter, unspecified: Secondary | ICD-10-CM

## 2016-06-27 ENCOUNTER — Ambulatory Visit
Admission: RE | Admit: 2016-06-27 | Discharge: 2016-06-27 | Disposition: A | Discharge status: Home / Self Care | Payer: 59 | Source: Ambulatory Visit | Attending: Obstetrics and Gynecology | Admitting: Obstetrics and Gynecology

## 2016-06-27 DIAGNOSIS — E049 Nontoxic goiter, unspecified: Secondary | ICD-10-CM

## 2016-09-26 LAB — OB RESULTS CONSOLE GC/CHLAMYDIA
Chlamydia: NEGATIVE
Gonorrhea: NEGATIVE

## 2016-09-26 LAB — OB RESULTS CONSOLE HEPATITIS B SURFACE ANTIGEN: Hepatitis B Surface Ag: NEGATIVE

## 2016-09-26 LAB — OB RESULTS CONSOLE HIV ANTIBODY (ROUTINE TESTING): HIV: NONREACTIVE

## 2016-09-26 LAB — OB RESULTS CONSOLE RUBELLA ANTIBODY, IGM: Rubella: IMMUNE

## 2016-10-02 LAB — OB RESULTS CONSOLE ABO/RH: RH Type: POSITIVE

## 2017-02-06 LAB — OB RESULTS CONSOLE RPR: RPR: NONREACTIVE

## 2017-02-26 NOTE — L&D Delivery Note (Signed)
Delivery Note At 12:26 AM a viable and healthy female was delivered via VBAC, Spontaneous (Presentation:OA ;vtx  ).  APGAR:8 , 9; weight pending .   Placenta status:spontaneous,intact not sent , .  Cord:  with the following complications: CAN x1 not reducible .  Cord pH: none  Anesthesia:  epidural Episiotomy: None Lacerations: None Suture Repair: n/a Est. Blood Loss (mL): 100  Mom to postpartum.  Baby to Couplet care / Skin to Skin.  Ugochukwu Chichester A Arden Axon 05/02/2017, 12:40 AM

## 2017-03-28 LAB — OB RESULTS CONSOLE GBS: GBS: NEGATIVE

## 2017-05-01 ENCOUNTER — Inpatient Hospital Stay (HOSPITAL_COMMUNITY): Payer: 59 | Admitting: Anesthesiology

## 2017-05-01 ENCOUNTER — Other Ambulatory Visit: Payer: Self-pay

## 2017-05-01 ENCOUNTER — Encounter (HOSPITAL_COMMUNITY): Payer: Self-pay

## 2017-05-01 ENCOUNTER — Inpatient Hospital Stay (HOSPITAL_COMMUNITY)
Admission: AD | Admit: 2017-05-01 | Discharge: 2017-05-03 | DRG: 807 | Disposition: A | Discharge status: Home / Self Care | Payer: 59 | Source: Ambulatory Visit | Attending: Obstetrics and Gynecology | Admitting: Obstetrics and Gynecology

## 2017-05-01 DIAGNOSIS — O34211 Maternal care for low transverse scar from previous cesarean delivery: Secondary | ICD-10-CM | POA: Diagnosis present

## 2017-05-01 DIAGNOSIS — O134 Gestational [pregnancy-induced] hypertension without significant proteinuria, complicating childbirth: Secondary | ICD-10-CM | POA: Diagnosis present

## 2017-05-01 DIAGNOSIS — O9902 Anemia complicating childbirth: Secondary | ICD-10-CM | POA: Diagnosis present

## 2017-05-01 DIAGNOSIS — O139 Gestational [pregnancy-induced] hypertension without significant proteinuria, unspecified trimester: Secondary | ICD-10-CM | POA: Diagnosis present

## 2017-05-01 DIAGNOSIS — D509 Iron deficiency anemia, unspecified: Secondary | ICD-10-CM | POA: Diagnosis present

## 2017-05-01 DIAGNOSIS — O480 Post-term pregnancy: Secondary | ICD-10-CM | POA: Diagnosis present

## 2017-05-01 DIAGNOSIS — D649 Anemia, unspecified: Secondary | ICD-10-CM

## 2017-05-01 DIAGNOSIS — O358XX0 Maternal care for other (suspected) fetal abnormality and damage, not applicable or unspecified: Secondary | ICD-10-CM | POA: Diagnosis present

## 2017-05-01 DIAGNOSIS — Z3A40 40 weeks gestation of pregnancy: Secondary | ICD-10-CM

## 2017-05-01 LAB — COMPREHENSIVE METABOLIC PANEL
ALBUMIN: 3 g/dL — AB (ref 3.5–5.0)
ALT: 10 U/L — AB (ref 14–54)
AST: 19 U/L (ref 15–41)
Alkaline Phosphatase: 156 U/L — ABNORMAL HIGH (ref 38–126)
Anion gap: 7 (ref 5–15)
BUN: 12 mg/dL (ref 6–20)
CHLORIDE: 106 mmol/L (ref 101–111)
CO2: 21 mmol/L — ABNORMAL LOW (ref 22–32)
Calcium: 8.5 mg/dL — ABNORMAL LOW (ref 8.9–10.3)
Creatinine, Ser: 0.47 mg/dL (ref 0.44–1.00)
GFR calc Af Amer: >60 mL/min (ref >60)
Glucose, Bld: 82 mg/dL (ref 65–99)
POTASSIUM: 3.8 mmol/L (ref 3.5–5.1)
Sodium: 134 mmol/L — ABNORMAL LOW (ref 135–145)
Total Bilirubin: 0.4 mg/dL (ref 0.3–1.2)
Total Protein: 6.3 g/dL — ABNORMAL LOW (ref 6.5–8.1)

## 2017-05-01 LAB — URIC ACID: Uric Acid, Serum: 4 mg/dL (ref 2.3–6.6)

## 2017-05-01 LAB — TYPE AND SCREEN
ABO/RH(D): B POS
ANTIBODY SCREEN: NEGATIVE

## 2017-05-01 LAB — CBC
HEMATOCRIT: 31.3 % — AB (ref 36.0–46.0)
Hemoglobin: 9.7 g/dL — ABNORMAL LOW (ref 12.0–15.0)
MCH: 21.1 pg — ABNORMAL LOW (ref 26.0–34.0)
MCHC: 31 g/dL (ref 30.0–36.0)
MCV: 68.2 fL — AB (ref 78.0–100.0)
Platelets: 222 10*3/uL (ref 150–400)
RBC: 4.59 MIL/uL (ref 3.87–5.11)
RDW: 15.7 % — AB (ref 11.5–15.5)
WBC: 7.5 10*3/uL (ref 4.0–10.5)

## 2017-05-01 LAB — PROTEIN / CREATININE RATIO, URINE: Creatinine, Urine: 41 mg/dL

## 2017-05-01 LAB — ABO/RH: ABO/RH(D): B POS

## 2017-05-01 LAB — LACTATE DEHYDROGENASE: LDH: 154 U/L (ref 98–192)

## 2017-05-01 MED ORDER — EPHEDRINE 5 MG/ML INJ
10.0000 mg | INTRAVENOUS | Status: DC | PRN
  Filled 2017-05-01: qty 2

## 2017-05-01 MED ORDER — LACTATED RINGERS IV SOLN: 500.0000 mL | INTRAVENOUS | Status: DC | PRN

## 2017-05-01 MED ORDER — ACETAMINOPHEN 325 MG PO TABS: 650.0000 mg | ORAL_TABLET | ORAL | Status: DC | PRN

## 2017-05-01 MED ORDER — LIDOCAINE HCL (PF) 1 % IJ SOLN
INTRAMUSCULAR | Status: DC | PRN
  Administered 2017-05-01 (×2): 4 mL via EPIDURAL

## 2017-05-01 MED ORDER — LACTATED RINGERS IV SOLN
500.0000 mL | Freq: Once | INTRAVENOUS | Status: AC
  Administered 2017-05-01: 500 mL via INTRAVENOUS

## 2017-05-01 MED ORDER — OXYCODONE-ACETAMINOPHEN 5-325 MG PO TABS: 1.0000 | ORAL_TABLET | ORAL | Status: DC | PRN

## 2017-05-01 MED ORDER — DIPHENHYDRAMINE HCL 50 MG/ML IJ SOLN: 12.5000 mg | INTRAMUSCULAR | Status: DC | PRN

## 2017-05-01 MED ORDER — OXYTOCIN 40 UNITS IN LACTATED RINGERS INFUSION - SIMPLE MED
1.0000 m[IU]/min | INTRAVENOUS | Status: DC
  Administered 2017-05-01: 2 m[IU]/min via INTRAVENOUS
  Filled 2017-05-01: qty 1000

## 2017-05-01 MED ORDER — SOD CITRATE-CITRIC ACID 500-334 MG/5ML PO SOLN: 30.0000 mL | ORAL | Status: DC | PRN

## 2017-05-01 MED ORDER — ONDANSETRON HCL 4 MG/2ML IJ SOLN: 4.0000 mg | Freq: Four times a day (QID) | INTRAMUSCULAR | Status: DC | PRN

## 2017-05-01 MED ORDER — FLEET ENEMA 7-19 GM/118ML RE ENEM: 1.0000 | ENEMA | RECTAL | Status: DC | PRN

## 2017-05-01 MED ORDER — OXYTOCIN 40 UNITS IN LACTATED RINGERS INFUSION - SIMPLE MED
2.5000 [IU]/h | INTRAVENOUS | Status: DC
  Administered 2017-05-02: 2.5 [IU]/h via INTRAVENOUS

## 2017-05-01 MED ORDER — PHENYLEPHRINE 40 MCG/ML (10ML) SYRINGE FOR IV PUSH (FOR BLOOD PRESSURE SUPPORT)
80.0000 ug | PREFILLED_SYRINGE | INTRAVENOUS | Status: DC | PRN
  Filled 2017-05-01: qty 5

## 2017-05-01 MED ORDER — OXYCODONE-ACETAMINOPHEN 5-325 MG PO TABS: 2.0000 | ORAL_TABLET | ORAL | Status: DC | PRN

## 2017-05-01 MED ORDER — OXYTOCIN BOLUS FROM INFUSION
500.0000 mL | Freq: Once | INTRAVENOUS | Status: AC
  Administered 2017-05-02: 500 mL via INTRAVENOUS

## 2017-05-01 MED ORDER — LACTATED RINGERS IV SOLN
INTRAVENOUS | Status: DC
  Administered 2017-05-01: 23:00:00 via INTRAUTERINE

## 2017-05-01 MED ORDER — LIDOCAINE HCL (PF) 1 % IJ SOLN
30.0000 mL | INTRAMUSCULAR | Status: DC | PRN
  Filled 2017-05-01: qty 30

## 2017-05-01 MED ORDER — PHENYLEPHRINE 40 MCG/ML (10ML) SYRINGE FOR IV PUSH (FOR BLOOD PRESSURE SUPPORT)
80.0000 ug | PREFILLED_SYRINGE | INTRAVENOUS | Status: DC | PRN
  Filled 2017-05-01: qty 10
  Filled 2017-05-01: qty 5

## 2017-05-01 MED ORDER — BUTORPHANOL TARTRATE 2 MG/ML IJ SOLN
2.0000 mg | INTRAMUSCULAR | Status: DC | PRN
  Administered 2017-05-01: 2 mg via INTRAVENOUS
  Filled 2017-05-01: qty 2

## 2017-05-01 MED ORDER — FENTANYL 2.5 MCG/ML BUPIVACAINE 1/10 % EPIDURAL INFUSION (WH - ANES)
14.0000 mL/h | INTRAMUSCULAR | Status: DC | PRN
  Administered 2017-05-01: 10 mL/h via EPIDURAL
  Filled 2017-05-01: qty 100

## 2017-05-01 MED ORDER — LACTATED RINGERS IV SOLN
INTRAVENOUS | Status: DC
  Administered 2017-05-01 (×2): via INTRAVENOUS

## 2017-05-01 MED ORDER — TERBUTALINE SULFATE 1 MG/ML IJ SOLN
0.2500 mg | Freq: Once | INTRAMUSCULAR | Status: DC | PRN
  Filled 2017-05-01: qty 1

## 2017-05-01 NOTE — Progress Notes (Signed)
Melina SchoolsHji Loudenslager is a 27 y.o. G3P2001 at [redacted]w[redacted]d by LMP admitted for induction of labor due to gestational HTN.  Subjective: called by RN due to variable deceleration. (+) bulging bag Notes pain with ctx  Objective: BP 127/80   Pulse 83   Temp 98.2 F (36.8 C) (Oral)   Resp 16   Ht 4\' 9"  (1.448 m)   Wt 63.3 kg (139 lb 8 oz)   SpO2 99%   BMI 30.19 kg/m  No intake/output data recorded. Total I/O In: -  Out: 800 [Urine:800] Epidural Pitocin 8 miu FHT:  FHR: 145 bpm, variability: moderate,  accelerations:  Abscent,  decelerations:  Present variables with ctx UC:   regular, every 2-3 minutes SVE:   8-9 cm dilated, 100% effaced, -2 station,asynclitic AROM clear fluid.IUPC/ISE placed  Labs: Lab Results  Component Value Date   WBC 7.5 05/01/2017   HGB 9.7 (L) 05/01/2017   HCT 31.3 (L) 05/01/2017   MCV 68.2 (L) 05/01/2017   PLT 222 05/01/2017    Assessment / Plan: Induction of labor due to gestational hypertension,  progressing well on pitocin  Previous C/S with hx successful VBAC P) amnioinfusion. Right exaggerated sims. Cont pitocin  Anticipated MOD:  NSVD  Sheilah Rayos A Alisa Stjames 05/01/2017, 11:08 PM

## 2017-05-01 NOTE — H&P (Signed)
Sandra Olsen is a 27 y.o. female presenting @ 40 3/7 weeks for IO, due to BP 138/90 and proteinuria on office visit today. Hx prev LTCS with successful VBAC. Denies h/a, visual changes (+) leg swelling  OB History    Gravida Para Term Preterm AB Living   3 2 2     1    SAB TAB Ectopic Multiple Live Births         0 1     Past Medical History:  Diagnosis Date  . IDA (iron deficiency anemia) 02/03/2015  . Microcytic anemia 07/08/2015  . Pregnancy induced hypertension    Past Surgical History:  Procedure Laterality Date  . CESAREAN SECTION    . WISDOM TOOTH EXTRACTION  11/2013   Family History: family history includes Diabetes in her father and mother. Social History:  reports that  has never smoked. she has never used smokeless tobacco. She reports that she does not drink alcohol or use drugs.     Maternal Diabetes: No Genetic Screening: Normal Maternal Ultrasounds/Referrals: Abnormal:  Findings:   Isolated choroid plexus cyst Fetal Ultrasounds or other Referrals:  None Maternal Substance Abuse:  No Significant Maternal Medications:  None Significant Maternal Lab Results:  Lab values include: Group B Strep negative Other Comments:  hgb Constant spring.   Review of Systems  Eyes: Negative for blurred vision.  Gastrointestinal: Negative for heartburn.  Neurological: Negative for headaches.   Maternal Medical History:  Fetal activity: Perceived fetal activity is normal.    Prenatal complications: Gestational HTN  Prenatal Complications - Diabetes: none.    Dilation: 3 Effacement (%): Thick Station: -3 Exam by:: Dr Cherly Hensen Blood pressure 109/67, pulse 96, temperature 98.8 F (37.1 C), temperature source Oral, resp. rate 16, height 4\' 9"  (1.448 m), weight 63.3 kg (139 lb 8 oz), unknown if currently breastfeeding. Exam Physical Exam  Constitutional: She is oriented to person, place, and time. She appears well-developed and well-nourished.  HENT:  Head: Atraumatic.  Eyes:  EOM are normal.  Neck: Neck supple.  Cardiovascular: Regular rhythm.  Respiratory: Breath sounds normal.  GI: Soft.  Neurological: She is alert and oriented to person, place, and time.  Skin: Skin is warm and dry.    Prenatal labs: ABO, Rh: --/--/B POS (03/06 1621) Antibody: NEG (03/06 1621) Rubella: Immune (08/01 0000) RPR: Nonreactive (12/12 0000)  HBsAg: Negative (08/01 0000)  HIV: Non-reactive (08/01 0000)  GBS: Negative (01/31 0000)  CBC    Component Value Date/Time   WBC 7.5 05/01/2017 1621   RBC 4.59 05/01/2017 1621   HGB 9.7 (L) 05/01/2017 1621   HCT 31.3 (L) 05/01/2017 1621   PLT 222 05/01/2017 1621   MCV 68.2 (L) 05/01/2017 1621   MCV 72.5 (A) 09/29/2011 1104   MCH 21.1 (L) 05/01/2017 1621   MCHC 31.0 05/01/2017 1621   RDW 15.7 (H) 05/01/2017 1621   LYMPHSABS 1.5 01/24/2007 1130   MONOABS 0.5 01/24/2007 1130   EOSABS 0.5 01/24/2007 1130   BASOSABS 0.0 01/24/2007 1130   CMP Latest Ref Rng & Units 05/01/2017 02/05/2015 02/03/2015  Glucose 65 - 99 mg/dL 82 75 83  BUN 6 - 20 mg/dL 12 16 12   Creatinine 0.44 - 1.00 mg/dL 2.95 6.21 3.08  Sodium 135 - 145 mmol/L 134(L) 135 136  Potassium 3.5 - 5.1 mmol/L 3.8 3.8 4.0  Chloride 101 - 111 mmol/L 106 106 106  CO2 22 - 32 mmol/L 21(L) 24 21(L)  Calcium 8.9 - 10.3 mg/dL 6.5(H) 8.3(L) 8.9  Total  Protein 6.5 - 8.1 g/dL 6.3(L) 5.4(L) 5.9(L)  Total Bilirubin 0.3 - 1.2 mg/dL 0.4 0.4 0.5  Alkaline Phos 38 - 126 U/L 156(H) 136(H) 189(H)  AST 15 - 41 U/L 19 25 21   ALT 14 - 54 U/L 10(L) 17 17   Assessment/Plan: Post term Previous C/S with successful VBAC P) admit routine labs. PIH labs. Pitocin induction/augmentation. Epidural prn  Shanoah Asbill A Asli Tokarski 05/01/2017, 7:09 PM

## 2017-05-01 NOTE — Anesthesia Procedure Notes (Signed)
Epidural Patient location during procedure: OB Start time: 05/01/2017 8:43 PM End time: 05/01/2017 8:57 PM  Staffing Anesthesiologist: Heather RobertsSinger, Shanigua Gibb, MD Performed: anesthesiologist   Preanesthetic Checklist Completed: patient identified, site marked, pre-op evaluation, timeout performed, IV checked, risks and benefits discussed and monitors and equipment checked  Epidural Patient position: sitting Prep: DuraPrep Patient monitoring: heart rate, cardiac monitor, continuous pulse ox and blood pressure Approach: midline Location: L2-L3 Injection technique: LOR saline  Needle:  Needle type: Tuohy  Needle gauge: 17 G Needle length: 9 cm Needle insertion depth: 6 cm Catheter size: 20 Guage Catheter at skin depth: 11 cm Test dose: negative and Other  Assessment Events: blood not aspirated, injection not painful, no injection resistance and negative IV test  Additional Notes Informed consent obtained prior to proceeding including risk of failure, 1% risk of PDPH, risk of minor discomfort and bruising.  Discussed rare but serious complications including epidural abscess, permanent nerve injury, epidural hematoma.  Discussed alternatives to epidural analgesia and patient desires to proceed.  Timeout performed pre-procedure verifying patient name, procedure, and platelet count.  Patient tolerated procedure well. Difficult placement.

## 2017-05-01 NOTE — Anesthesia Preprocedure Evaluation (Signed)
Anesthesia Evaluation  Patient identified by MRN, date of birth, ID band Patient awake    Reviewed: Allergy & Precautions, NPO status , Patient's Chart, lab work & pertinent test results  Airway Mallampati: II  TM Distance: >3 FB Neck ROM: Full    Dental no notable dental hx. (+) Dental Advisory Given   Pulmonary neg pulmonary ROS,    Pulmonary exam normal        Cardiovascular hypertension, Pt. on medications Normal cardiovascular exam     Neuro/Psych negative neurological ROS  negative psych ROS   GI/Hepatic negative GI ROS, Neg liver ROS,   Endo/Other  negative endocrine ROS  Renal/GU negative Renal ROS  negative genitourinary   Musculoskeletal negative musculoskeletal ROS (+)   Abdominal   Peds negative pediatric ROS (+)  Hematology  (+) anemia ,   Anesthesia Other Findings   Reproductive/Obstetrics negative OB ROS                             Anesthesia Physical  Anesthesia Plan  ASA: III  Anesthesia Plan: Epidural   Post-op Pain Management:    Induction:   PONV Risk Score and Plan:   Airway Management Planned: Natural Airway  Additional Equipment:   Intra-op Plan:   Post-operative Plan:   Informed Consent: I have reviewed the patients History and Physical, chart, labs and discussed the procedure including the risks, benefits and alternatives for the proposed anesthesia with the patient or authorized representative who has indicated his/her understanding and acceptance.     Dental advisory given  Plan Discussed with: Anesthesiologist  Anesthesia Plan Comments:         Anesthesia Quick Evaluation  

## 2017-05-02 ENCOUNTER — Encounter (HOSPITAL_COMMUNITY): Payer: Self-pay

## 2017-05-02 LAB — CBC
HEMATOCRIT: 30.1 % — AB (ref 36.0–46.0)
Hemoglobin: 9.2 g/dL — ABNORMAL LOW (ref 12.0–15.0)
MCH: 20.9 pg — ABNORMAL LOW (ref 26.0–34.0)
MCHC: 30.6 g/dL (ref 30.0–36.0)
MCV: 68.4 fL — AB (ref 78.0–100.0)
Platelets: 242 10*3/uL (ref 150–400)
RBC: 4.4 MIL/uL (ref 3.87–5.11)
RDW: 16.2 % — AB (ref 11.5–15.5)
WBC: 11.4 10*3/uL — ABNORMAL HIGH (ref 4.0–10.5)

## 2017-05-02 LAB — RPR: RPR: NONREACTIVE

## 2017-05-02 MED ORDER — ONDANSETRON HCL 4 MG/2ML IJ SOLN: 4.0000 mg | INTRAMUSCULAR | Status: DC | PRN

## 2017-05-02 MED ORDER — PRENATAL MULTIVITAMIN CH
1.0000 | ORAL_TABLET | Freq: Every day | ORAL | Status: DC
  Administered 2017-05-02: 1 via ORAL
  Filled 2017-05-02: qty 1

## 2017-05-02 MED ORDER — WITCH HAZEL-GLYCERIN EX PADS: 1.0000 "application " | MEDICATED_PAD | CUTANEOUS | Status: DC | PRN

## 2017-05-02 MED ORDER — ONDANSETRON HCL 4 MG PO TABS: 4.0000 mg | ORAL_TABLET | ORAL | Status: DC | PRN

## 2017-05-02 MED ORDER — IBUPROFEN 600 MG PO TABS
600.0000 mg | ORAL_TABLET | Freq: Four times a day (QID) | ORAL | Status: DC
  Administered 2017-05-02 – 2017-05-03 (×5): 600 mg via ORAL
  Filled 2017-05-02 (×4): qty 1

## 2017-05-02 MED ORDER — OXYCODONE HCL 5 MG PO TABS
5.0000 mg | ORAL_TABLET | ORAL | Status: DC | PRN
  Administered 2017-05-02 (×2): 5 mg via ORAL
  Filled 2017-05-02 (×2): qty 1

## 2017-05-02 MED ORDER — SENNOSIDES-DOCUSATE SODIUM 8.6-50 MG PO TABS
2.0000 | ORAL_TABLET | ORAL | Status: DC
  Administered 2017-05-03: 2 via ORAL
  Filled 2017-05-02: qty 2

## 2017-05-02 MED ORDER — FERROUS SULFATE 325 (65 FE) MG PO TABS
325.0000 mg | ORAL_TABLET | Freq: Two times a day (BID) | ORAL | Status: DC
  Administered 2017-05-02 – 2017-05-03 (×2): 325 mg via ORAL
  Filled 2017-05-02 (×2): qty 1

## 2017-05-02 MED ORDER — DIPHENHYDRAMINE HCL 25 MG PO CAPS: 25.0000 mg | ORAL_CAPSULE | Freq: Four times a day (QID) | ORAL | Status: DC | PRN

## 2017-05-02 MED ORDER — ZOLPIDEM TARTRATE 5 MG PO TABS: 5.0000 mg | ORAL_TABLET | Freq: Every evening | ORAL | Status: DC | PRN

## 2017-05-02 MED ORDER — BENZOCAINE-MENTHOL 20-0.5 % EX AERO: 1.0000 "application " | INHALATION_SPRAY | CUTANEOUS | Status: DC | PRN

## 2017-05-02 MED ORDER — COCONUT OIL OIL: 1.0000 "application " | TOPICAL_OIL | Status: DC | PRN

## 2017-05-02 MED ORDER — OXYCODONE HCL 5 MG PO TABS: 10.0000 mg | ORAL_TABLET | ORAL | Status: DC | PRN

## 2017-05-02 MED ORDER — DIBUCAINE 1 % RE OINT: 1.0000 "application " | TOPICAL_OINTMENT | RECTAL | Status: DC | PRN

## 2017-05-02 MED ORDER — SIMETHICONE 80 MG PO CHEW: 80.0000 mg | CHEWABLE_TABLET | ORAL | Status: DC | PRN

## 2017-05-02 MED ORDER — ACETAMINOPHEN 325 MG PO TABS: 650.0000 mg | ORAL_TABLET | ORAL | Status: DC | PRN

## 2017-05-02 NOTE — Progress Notes (Signed)
Patient states she is having cramping that feels like contractions.  Education provided regarding the uterine cramping, especially with breastfeeding.  Infant just nursed for an hour;  Offered medication to patient- Tylenol and 5 mg Oxy- but patient declined Tylenol and only wanted Oxy at this time.  Emotional support provided.  Will continue to monitor.

## 2017-05-02 NOTE — Anesthesia Postprocedure Evaluation (Signed)
Anesthesia Post Note  Patient: Sandra Olsen  Procedure(s) Performed: AN AD HOC LABOR EPIDURAL     Patient location during evaluation: Mother Baby Anesthesia Type: Epidural Level of consciousness: awake Pain management: satisfactory to patient Vital Signs Assessment: post-procedure vital signs reviewed and stable Respiratory status: spontaneous breathing Cardiovascular status: stable Anesthetic complications: no    Last Vitals:  Vitals:   05/02/17 0345 05/02/17 0800  BP: 119/67 108/66  Pulse: 78 80  Resp: 18   Temp: 37.3 C 36.7 C  SpO2: 98%     Last Pain:  Vitals:   05/02/17 1110  TempSrc:   PainSc: 5    Pain Goal:                 KeyCorpBURGER,Karmon Andis

## 2017-05-02 NOTE — Progress Notes (Signed)
Post Partum Day 0 S/P VBAC  Feeding: bottle Subjective: No HA, SOB, CP, F/C, breast symptoms. Normal vaginal bleeding, no clots. Pain controlled.  ambulating without symptoms.  voiding without difficulty    Objective: BP 108/66   Pulse 80   Temp 98.1 F (36.7 C) (Oral)   Resp 18   Ht 4\' 9"  (1.448 m)   Wt 63.3 kg (139 lb 8 oz)   SpO2 98%   Breastfeeding? Unknown   BMI 30.19 kg/m  I&O reviewed.   Physical Exam:  General: alert, cooperative and no distress Lochia: appropriate Uterine Fundus: firm DVT Evaluation: No evidence of DVT seen on physical exam. Ext: No c/c/e Recent Labs    05/01/17 1621 05/02/17 0113  HGB 9.7* 9.2*  HCT 31.3* 30.1*      Assessment/Plan: 27 y.o.  PPD #0 .  normal postpartum exam Continue current postpartum care Ambulate   LOS: 1 day   Lendon ColonelKelly A Darrie Macmillan 05/02/2017 10:59 AM

## 2017-05-03 DIAGNOSIS — D649 Anemia, unspecified: Secondary | ICD-10-CM

## 2017-05-03 LAB — CBC
HCT: 28.6 % — ABNORMAL LOW (ref 36.0–46.0)
Hemoglobin: 9.1 g/dL — ABNORMAL LOW (ref 12.0–15.0)
MCH: 21.5 pg — ABNORMAL LOW (ref 26.0–34.0)
MCHC: 31.8 g/dL (ref 30.0–36.0)
MCV: 67.5 fL — ABNORMAL LOW (ref 78.0–100.0)
Platelets: 207 10*3/uL (ref 150–400)
RBC: 4.24 MIL/uL (ref 3.87–5.11)
RDW: 15.9 % — ABNORMAL HIGH (ref 11.5–15.5)
WBC: 7.8 10*3/uL (ref 4.0–10.5)

## 2017-05-03 MED ORDER — IBUPROFEN 600 MG PO TABS: 600.0000 mg | ORAL_TABLET | Freq: Four times a day (QID) | ORAL | 0 refills | Status: AC

## 2017-05-03 MED ORDER — FERROUS SULFATE 325 (65 FE) MG PO TABS: 325.0000 mg | ORAL_TABLET | Freq: Two times a day (BID) | ORAL | 3 refills | Status: AC

## 2017-05-03 NOTE — Progress Notes (Signed)
PPD #1, VBAC, intact perineum, baby girl "Sandra Olsen"  S:  Reports feeling better today, very tired   Denies HA, visual changes, RUQ/epigastric pain              Tolerating po/ No nausea or vomiting / Denies dizziness or SOB             Bleeding is light             Pain controlled with Motrin and heating pad             Up ad lib / ambulatory / voiding QS  Newborn breast feeding with formula supplementation - states she formula fed her last 2 babies due to low supply   O:               VS: BP 106/68 (BP Location: Left Arm)   Pulse 83   Temp 97.6 F (36.4 C) (Oral)   Resp 16   Ht 4\' 9"  (1.448 m)   Wt 63.3 kg (139 lb 8 oz)   SpO2 98%   Breastfeeding? Unknown   BMI 30.19 kg/m     LABS:              Recent Labs    05/02/17 0113 05/03/17 0543  WBC 11.4* 7.8  HGB 9.2* 9.1*  PLT 242 207               Blood type: --/--/B POS, B POS Performed [facility], [address] [place], [place] [address]  (03/06 1621)  Rubella: Immune (08/01 0000)                                Physical Exam:             Alert and oriented X3  Lungs: Clear and unlabored  Heart: regular rate and rhythm / no murmurs  Abdomen: soft, non-tender, non-distended              Fundus: firm, non-tender, U-1  Perineum: intact, no significant edema, erythema, or ecchymosis   Lochia: scant, no clots   Extremities: no edema, no calf pain or tenderness    A: PPD # 1, VBAC  Gestational HTN - BPs stable since delivery, no neural s/s    Acute on chronic anemia - stable on oral FE  Doing well - stable status  P: Routine post partum orders  Discharge home today  [facility] discharge book and instructions reviewed   Smart start nurse for BP in 1 week   F/u with Dr. Cherly [facility]ensenousins [facility]   Carlean JewsMeredith Dawsyn Zurn, MSN, CNM [facility]

## 2017-05-03 NOTE — Discharge Summary (Signed)
Obstetric Discharge Summary   Patient Name: Sandra Olsen DOB: February 17, 1991 MRN: [id number]  Date of Admission: 05/01/2017 Date of Discharge: 05/03/2017 Date of Delivery: 05/02/2017 Gestational Age at Delivery: [redacted]w[redacted]d  Primary OB: [facility] - Dr. Cherly Hensen  Antepartum complications:  - Gestational HTN  - Hx. Of Prior C/S with successful VBAC - Bilateral CPC, normal Panorama  Prenatal Labs:  ABO, Rh: --/--/B POS (03/06 1621) Antibody: NEG (03/06 1621) Rubella: Immune (08/01 0000) RPR: Nonreactive (12/12 0000)  HBsAg: Negative (08/01 0000)  HIV: Non-reactive (08/01 0000)  GBS: Negative (01/31 0000)   Admitting Diagnosis: IOL posterm for gestational HTN   Secondary Diagnoses: Patient Active Problem List   Diagnosis Date Noted  . Gestational hypertension 05/01/2017  . Microcytosis 07/08/2015  . Vaginal birth after cesarean (VBAC) (12/9) 02/04/2015  . Postpartum care following VBAC (3/7) 02/04/2015  . Normal labor and delivery 02/03/2015  . Previous cesarean delivery affecting pregnancy, antepartum 02/03/2015  . IDA (iron deficiency anemia) 02/03/2015  . Rash and nonspecific skin eruption 06/02/2013    Augmentation:  Pitocin, AROM Complications: None  Date of Delivery: 05/02/2017 Delivered By: Dr. Cherly Hensen Delivery Type: VBAC Anesthesia: epidural Placenta: sponatneous Laceration: none Episiotomy: none  Newborn Data: Live born female  Birth Weight: 7 lb 7.9 oz (3400 g) APGAR: 8, 9  Newborn Delivery   Birth date/time:  05/02/2017 00:26:00 Delivery type:  VBAC, Spontaneous     Hospital/Postpartum Course  (Vaginal Delivery): Pt. Admitted for GHTN and postterm.  Pt. Progressed well with Pitocin and AROM.  See delivery summary for details.  Her BPs normalized after delivery. Patient' s postpartum course was complicated by anemia, but stable on oral FE.  By time of discharge on PPD#1, her pain was controlled on oral pain medications; she had appropriate lochia and was  ambulating, voiding without difficulty and tolerating regular diet.  She was deemed stable for discharge to home.    Labs: CBC Latest Ref Rng & Units 05/03/2017 05/02/2017 05/01/2017  WBC 4.0 - 10.5 K/uL 7.8 11.4(H) 7.5  Hemoglobin 12.0 - 15.0 g/dL 6.5(H) 8.4(O) 9.6(E)  Hematocrit 36.0 - 46.0 % 28.6(L) 30.1(L) 31.3(L)  Platelets 150 - 400 K/uL 207 242 222   Conflict (See Lab Report): B POS/B POS Performed at [facility], [address] [place], [place] [address]   Physical exam:  BP 106/68 (BP Location: Left Arm)   Pulse 83   Temp 97.6 F (36.4 C) (Oral)   Resp 16   Ht 4\' 9"  (1.448 m)   Wt 63.3 kg (139 lb 8 oz)   SpO2 98%   Breastfeeding? Unknown   BMI 30.19 kg/m  General: alert and no distress Pulm: normal respiratory effort Lochia: appropriate Abdomen: soft, NT Uterine Fundus: firm, below umbilicus Perineum: healing well, no significant erythema, no significant edema Extremities: No evidence of DVT seen on physical exam. No lower extremity edema.   Disposition: stable, discharge to home Baby Feeding: breast milk and formula Baby Disposition: home with mom  Contraception: unsure  Rh Immune globulin given: N/A Rubella vaccine given: N/A Tdap vaccine given in AP or PP setting: UTD Flu vaccine given in AP or PP setting: UTD   Plan:  Sandra Olsen was discharged to home in good condition. [facility]-up appointment at [facility] in 6 weeks. 1 week visit with [facility] nurse for BP check   Discharge Instructions: Per After Visit Summary. Refer to After Visit Summary and [facility] discharge booklet  Activity: Advance as tolerated. Pelvic rest for 6 weeks.  Diet: Regular, Heart Healthy Discharge Medications: Allergies as of 05/03/2017   No Known Allergies     Medication List    STOP taking these medications   FERRALET 90 PO     TAKE these medications   desonide 0.05 % ointment Commonly known as:  DESOWEN Apply 1 application topically daily as needed.    ferrous sulfate 325 (65 FE) MG tablet Take 1 tablet (325 mg total) by mouth 2 (two) times daily with a meal.   ibuprofen 600 MG tablet Commonly known as:  ADVIL,MOTRIN Take 1 tablet (600 mg total) by mouth every 6 (six) hours.   prenatal multivitamin Tabs tablet Take 1 tablet by mouth daily at 12 noon.      Outpatient follow up:  Follow-up Information    Maxie Betterousins, Sheronette, MD. Schedule an appointment as soon as possible for a visit in 6 week(s).   Specialty:  Obstetrics and Gynecology Why:  Postpartum visit; Smart Start nurse visit in 1 week for BP check  Contact information: [address] [place] [address] [phone]           Signed:  Carlean JewsMeredith Jacquetta Polhamus, MSN, CNM [facility]

## 2018-02-26 NOTE — L&D Delivery Note (Addendum)
OB/GYN Faculty Practice Delivery Note  Sandra Olsen is a 28 y.o. [phone] s/p vaginal delivery at [redacted]w[redacted]d.   ROM: 1h 42m with meconium-stained fluid GBS Status: negative Maximum Maternal Temperature: 98.60F  Delivery Date/Time: 02/09/19 Delivery: Called to room and patient was complete and pushing. Head delivered ROA. No nuchal cord present. Shoulder and body delivered in usual fashion. Infant with spontaneous cry, placed on mother's abdomen, dried and stimulated. Cord clamped x 2 after 1-minute delay, and cut by FOB under my direct supervision. Cord blood drawn. Placenta delivered spontaneously with gentle cord traction.   Dr. Garwin Brothers arrived at this time and assumed care of the patient.  Merilyn Baba, DO OB/GYN Fellow, Faculty Practice      Addendum: called by RN regarding pt complete and involuntarily pushing. On arrival , baby delivered and placenta also delivered. Exam showed intact perineum

## 2018-03-26 ENCOUNTER — Ambulatory Visit: Payer: Self-pay | Admitting: Emergency Medicine

## 2018-05-02 LAB — OB RESULTS CONSOLE GC/CHLAMYDIA
Chlamydia: NEGATIVE
Gonorrhea: NEGATIVE

## 2018-05-02 LAB — OB RESULTS CONSOLE RPR: RPR: NONREACTIVE

## 2018-05-02 LAB — OB RESULTS CONSOLE RUBELLA ANTIBODY, IGM: Rubella: IMMUNE

## 2018-05-02 LAB — OB RESULTS CONSOLE HIV ANTIBODY (ROUTINE TESTING): HIV: NONREACTIVE

## 2018-05-02 LAB — OB RESULTS CONSOLE HEPATITIS B SURFACE ANTIGEN: Hepatitis B Surface Ag: NEGATIVE

## 2018-11-10 DIAGNOSIS — Z3482 Encounter for supervision of other normal pregnancy, second trimester: Secondary | ICD-10-CM | POA: Diagnosis not present

## 2018-11-17 DIAGNOSIS — Z3689 Encounter for other specified antenatal screening: Secondary | ICD-10-CM | POA: Diagnosis not present

## 2018-11-25 DIAGNOSIS — Z3483 Encounter for supervision of other normal pregnancy, third trimester: Secondary | ICD-10-CM | POA: Diagnosis not present

## 2018-11-25 DIAGNOSIS — Z3689 Encounter for other specified antenatal screening: Secondary | ICD-10-CM | POA: Diagnosis not present

## 2018-12-12 DIAGNOSIS — Z3483 Encounter for supervision of other normal pregnancy, third trimester: Secondary | ICD-10-CM | POA: Diagnosis not present

## 2018-12-12 DIAGNOSIS — Z23 Encounter for immunization: Secondary | ICD-10-CM | POA: Diagnosis not present

## 2018-12-25 DIAGNOSIS — Z3483 Encounter for supervision of other normal pregnancy, third trimester: Secondary | ICD-10-CM | POA: Diagnosis not present

## 2019-01-09 DIAGNOSIS — Z3685 Encounter for antenatal screening for Streptococcus B: Secondary | ICD-10-CM | POA: Diagnosis not present

## 2019-01-09 DIAGNOSIS — Z3483 Encounter for supervision of other normal pregnancy, third trimester: Secondary | ICD-10-CM | POA: Diagnosis not present

## 2019-01-09 LAB — OB RESULTS CONSOLE HIV ANTIBODY (ROUTINE TESTING): HIV: NONREACTIVE

## 2019-01-09 LAB — OB RESULTS CONSOLE RPR: RPR: NONREACTIVE

## 2019-01-09 LAB — OB RESULTS CONSOLE RUBELLA ANTIBODY, IGM: Rubella: IMMUNE

## 2019-01-09 LAB — OB RESULTS CONSOLE GC/CHLAMYDIA
Chlamydia: NEGATIVE
Gonorrhea: NEGATIVE

## 2019-01-09 LAB — OB RESULTS CONSOLE HEPATITIS B SURFACE ANTIGEN: Hepatitis B Surface Ag: NEGATIVE

## 2019-01-09 LAB — OB RESULTS CONSOLE GBS: GBS: NEGATIVE

## 2019-01-15 DIAGNOSIS — Z3483 Encounter for supervision of other normal pregnancy, third trimester: Secondary | ICD-10-CM | POA: Diagnosis not present

## 2019-01-21 DIAGNOSIS — Z3483 Encounter for supervision of other normal pregnancy, third trimester: Secondary | ICD-10-CM | POA: Diagnosis not present

## 2019-01-30 DIAGNOSIS — Z3483 Encounter for supervision of other normal pregnancy, third trimester: Secondary | ICD-10-CM | POA: Diagnosis not present

## 2019-02-03 DIAGNOSIS — Z3483 Encounter for supervision of other normal pregnancy, third trimester: Secondary | ICD-10-CM | POA: Diagnosis not present

## 2019-02-09 ENCOUNTER — Other Ambulatory Visit: Payer: Self-pay

## 2019-02-09 ENCOUNTER — Inpatient Hospital Stay (HOSPITAL_COMMUNITY)
Admission: AD | Admit: 2019-02-09 | Discharge: 2019-02-10 | DRG: 807 | Disposition: A | Discharge status: Home / Self Care | Payer: BC Managed Care – PPO | Attending: Obstetrics and Gynecology | Admitting: Obstetrics and Gynecology

## 2019-02-09 ENCOUNTER — Inpatient Hospital Stay (HOSPITAL_COMMUNITY): Payer: BC Managed Care – PPO | Admitting: Anesthesiology

## 2019-02-09 ENCOUNTER — Encounter (HOSPITAL_COMMUNITY): Payer: Self-pay | Admitting: Obstetrics and Gynecology

## 2019-02-09 DIAGNOSIS — O480 Post-term pregnancy: Principal | ICD-10-CM | POA: Diagnosis present

## 2019-02-09 DIAGNOSIS — Z3A40 40 weeks gestation of pregnancy: Secondary | ICD-10-CM | POA: Diagnosis not present

## 2019-02-09 DIAGNOSIS — Z23 Encounter for immunization: Secondary | ICD-10-CM | POA: Diagnosis not present

## 2019-02-09 DIAGNOSIS — O9902 Anemia complicating childbirth: Secondary | ICD-10-CM | POA: Diagnosis present

## 2019-02-09 DIAGNOSIS — Z20828 Contact with and (suspected) exposure to other viral communicable diseases: Secondary | ICD-10-CM | POA: Diagnosis not present

## 2019-02-09 DIAGNOSIS — D649 Anemia, unspecified: Secondary | ICD-10-CM | POA: Diagnosis present

## 2019-02-09 DIAGNOSIS — O34219 Maternal care for unspecified type scar from previous cesarean delivery: Secondary | ICD-10-CM | POA: Diagnosis not present

## 2019-02-09 DIAGNOSIS — O164 Unspecified maternal hypertension, complicating childbirth: Secondary | ICD-10-CM | POA: Diagnosis not present

## 2019-02-09 DIAGNOSIS — O26893 Other specified pregnancy related conditions, third trimester: Secondary | ICD-10-CM | POA: Diagnosis not present

## 2019-02-09 DIAGNOSIS — O34211 Maternal care for low transverse scar from previous cesarean delivery: Secondary | ICD-10-CM | POA: Diagnosis present

## 2019-02-09 LAB — ABO/RH: ABO/RH(D): B POS

## 2019-02-09 LAB — CBC
HCT: 36.5 % (ref 36.0–46.0)
Hemoglobin: 10.7 g/dL — ABNORMAL LOW (ref 12.0–15.0)
MCH: 21.2 pg — ABNORMAL LOW (ref 26.0–34.0)
MCHC: 29.3 g/dL — ABNORMAL LOW (ref 30.0–36.0)
MCV: 72.3 fL — ABNORMAL LOW (ref 80.0–100.0)
Platelets: 243 10*3/uL (ref 150–400)
RBC: 5.05 MIL/uL (ref 3.87–5.11)
RDW: 14.8 % (ref 11.5–15.5)
WBC: 9.3 10*3/uL (ref 4.0–10.5)
nRBC: 0 % (ref 0.0–0.2)

## 2019-02-09 LAB — RESPIRATORY PANEL BY RT PCR (FLU A&B, COVID)
Influenza A by PCR: NEGATIVE
Influenza B by PCR: NEGATIVE
SARS Coronavirus 2 by RT PCR: NEGATIVE

## 2019-02-09 LAB — TYPE AND SCREEN
ABO/RH(D): B POS
Antibody Screen: NEGATIVE

## 2019-02-09 MED ORDER — OXYCODONE-ACETAMINOPHEN 5-325 MG PO TABS: 1.0000 | ORAL_TABLET | ORAL | Status: DC | PRN

## 2019-02-09 MED ORDER — OXYCODONE-ACETAMINOPHEN 5-325 MG PO TABS: 2.0000 | ORAL_TABLET | ORAL | Status: DC | PRN

## 2019-02-09 MED ORDER — DIPHENHYDRAMINE HCL 50 MG/ML IJ SOLN: 12.5000 mg | INTRAMUSCULAR | Status: DC | PRN

## 2019-02-09 MED ORDER — EPHEDRINE 5 MG/ML INJ: 10.0000 mg | INTRAVENOUS | Status: DC | PRN

## 2019-02-09 MED ORDER — ONDANSETRON HCL 4 MG/2ML IJ SOLN: 4.0000 mg | INTRAMUSCULAR | Status: DC | PRN

## 2019-02-09 MED ORDER — OXYTOCIN 40 UNITS IN NORMAL SALINE INFUSION - SIMPLE MED
2.5000 [IU]/h | INTRAVENOUS | Status: DC
  Filled 2019-02-09 (×2): qty 1000

## 2019-02-09 MED ORDER — OXYCODONE HCL 5 MG PO TABS
10.0000 mg | ORAL_TABLET | ORAL | Status: DC | PRN
  Administered 2019-02-10 (×2): 10 mg via ORAL
  Filled 2019-02-09 (×2): qty 2

## 2019-02-09 MED ORDER — FENTANYL-BUPIVACAINE-NACL 0.5-0.125-0.9 MG/250ML-% EP SOLN
12.0000 mL/h | EPIDURAL | Status: DC | PRN
  Filled 2019-02-09: qty 250

## 2019-02-09 MED ORDER — TERBUTALINE SULFATE 1 MG/ML IJ SOLN: 0.2500 mg | Freq: Once | INTRAMUSCULAR | Status: DC | PRN

## 2019-02-09 MED ORDER — PHENYLEPHRINE 40 MCG/ML (10ML) SYRINGE FOR IV PUSH (FOR BLOOD PRESSURE SUPPORT): 80.0000 ug | PREFILLED_SYRINGE | INTRAVENOUS | Status: DC | PRN

## 2019-02-09 MED ORDER — ACETAMINOPHEN 325 MG PO TABS: 650.0000 mg | ORAL_TABLET | ORAL | Status: DC | PRN

## 2019-02-09 MED ORDER — COCONUT OIL OIL: 1.0000 "application " | TOPICAL_OIL | Status: DC | PRN

## 2019-02-09 MED ORDER — PRENATAL MULTIVITAMIN CH
1.0000 | ORAL_TABLET | Freq: Every day | ORAL | Status: DC
  Administered 2019-02-10: 12:00:00 1 via ORAL
  Filled 2019-02-09: qty 1

## 2019-02-09 MED ORDER — DIPHENHYDRAMINE HCL 25 MG PO CAPS: 25.0000 mg | ORAL_CAPSULE | Freq: Four times a day (QID) | ORAL | Status: DC | PRN

## 2019-02-09 MED ORDER — LACTATED RINGERS IV SOLN
500.0000 mL | Freq: Once | INTRAVENOUS | Status: AC
  Administered 2019-02-09: 500 mL via INTRAVENOUS

## 2019-02-09 MED ORDER — OXYTOCIN 40 UNITS IN NORMAL SALINE INFUSION - SIMPLE MED
1.0000 m[IU]/min | INTRAVENOUS | Status: DC
  Administered 2019-02-09: 2 m[IU]/min via INTRAVENOUS

## 2019-02-09 MED ORDER — OXYCODONE HCL 5 MG PO TABS
5.0000 mg | ORAL_TABLET | ORAL | Status: DC | PRN
  Administered 2019-02-10: 5 mg via ORAL
  Filled 2019-02-09: qty 1

## 2019-02-09 MED ORDER — OXYTOCIN BOLUS FROM INFUSION
500.0000 mL | Freq: Once | INTRAVENOUS | Status: AC
  Administered 2019-02-09: 500 mL via INTRAVENOUS

## 2019-02-09 MED ORDER — ZOLPIDEM TARTRATE 5 MG PO TABS: 5.0000 mg | ORAL_TABLET | Freq: Every evening | ORAL | Status: DC | PRN

## 2019-02-09 MED ORDER — LACTATED RINGERS IV SOLN
INTRAVENOUS | Status: DC
  Administered 2019-02-09 (×2): via INTRAVENOUS

## 2019-02-09 MED ORDER — DIBUCAINE (PERIANAL) 1 % EX OINT: 1.0000 "application " | TOPICAL_OINTMENT | CUTANEOUS | Status: DC | PRN

## 2019-02-09 MED ORDER — IBUPROFEN 600 MG PO TABS
600.0000 mg | ORAL_TABLET | Freq: Four times a day (QID) | ORAL | Status: DC
  Administered 2019-02-09 – 2019-02-10 (×4): 600 mg via ORAL
  Filled 2019-02-09 (×4): qty 1

## 2019-02-09 MED ORDER — LACTATED RINGERS IV SOLN: 500.0000 mL | INTRAVENOUS | Status: DC | PRN

## 2019-02-09 MED ORDER — OXYTOCIN 10 UNIT/ML IJ SOLN: 10.0000 [IU] | Freq: Once | INTRAMUSCULAR | Status: DC

## 2019-02-09 MED ORDER — SODIUM CHLORIDE (PF) 0.9 % IJ SOLN
INTRAMUSCULAR | Status: DC | PRN
  Administered 2019-02-09: 12 mL/h via EPIDURAL

## 2019-02-09 MED ORDER — ONDANSETRON HCL 4 MG PO TABS: 4.0000 mg | ORAL_TABLET | ORAL | Status: DC | PRN

## 2019-02-09 MED ORDER — WITCH HAZEL-GLYCERIN EX PADS: 1.0000 "application " | MEDICATED_PAD | CUTANEOUS | Status: DC | PRN

## 2019-02-09 MED ORDER — LIDOCAINE HCL (PF) 1 % IJ SOLN
30.0000 mL | INTRAMUSCULAR | Status: AC | PRN
  Administered 2019-02-09: 11 mL via SUBCUTANEOUS

## 2019-02-09 MED ORDER — ONDANSETRON HCL 4 MG/2ML IJ SOLN: 4.0000 mg | Freq: Four times a day (QID) | INTRAMUSCULAR | Status: DC | PRN

## 2019-02-09 MED ORDER — FERROUS SULFATE 325 (65 FE) MG PO TABS
325.0000 mg | ORAL_TABLET | Freq: Two times a day (BID) | ORAL | Status: DC
  Administered 2019-02-10 (×2): 325 mg via ORAL
  Filled 2019-02-09 (×2): qty 1

## 2019-02-09 MED ORDER — SIMETHICONE 80 MG PO CHEW: 80.0000 mg | CHEWABLE_TABLET | ORAL | Status: DC | PRN

## 2019-02-09 MED ORDER — BENZOCAINE-MENTHOL 20-0.5 % EX AERO
1.0000 "application " | INHALATION_SPRAY | CUTANEOUS | Status: DC | PRN
  Administered 2019-02-10: 1 via TOPICAL
  Filled 2019-02-09: qty 56

## 2019-02-09 MED ORDER — SOD CITRATE-CITRIC ACID 500-334 MG/5ML PO SOLN: 30.0000 mL | ORAL | Status: DC | PRN

## 2019-02-09 MED ORDER — SENNOSIDES-DOCUSATE SODIUM 8.6-50 MG PO TABS
2.0000 | ORAL_TABLET | ORAL | Status: DC
  Administered 2019-02-09: 2 via ORAL
  Filled 2019-02-09: qty 2

## 2019-02-09 NOTE — MAU Note (Signed)
Pt c/ol ctx. Feels very uncomfortable. SVE 9/100/0 with BBOW. Dr. Garwin Brothers called.  FHR 135

## 2019-02-09 NOTE — Anesthesia Procedure Notes (Signed)
Epidural Patient location during procedure: OB Start time: 02/09/2019 5:06 PM End time: 02/09/2019 5:19 PM  Staffing Anesthesiologist: Lynda Rainwater, MD Performed: anesthesiologist   Preanesthetic Checklist Completed: patient identified, IV checked, site marked, risks and benefits discussed, surgical consent, monitors and equipment checked, pre-op evaluation and timeout performed  Epidural Patient position: sitting Prep: ChloraPrep Patient monitoring: heart rate, cardiac monitor, continuous pulse ox and blood pressure Approach: midline Location: L2-L3 Injection technique: LOR saline  Needle:  Needle type: Tuohy  Needle gauge: 17 G Needle length: 9 cm Needle insertion depth: 5 cm Catheter type: closed end flexible Catheter size: 20 Guage Catheter at skin depth: 9 cm Test dose: negative  Assessment Events: blood not aspirated, injection not painful, no injection resistance, no paresthesia and negative IV test  Additional Notes Reason for block:procedure for pain

## 2019-02-09 NOTE — H&P (Signed)
Sandra Olsen is a 28 y.o. female presenting @ 60 2/[redacted] wk gestation in active labor. (+) FM . Prev C/S with successful VBAC OB History    Gravida  4   Para  3   Term  3   Preterm      AB      Living  2     SAB      TAB      Ectopic      Multiple  0   Live Births  2          Past Medical History:  Diagnosis Date  . IDA (iron deficiency anemia) 02/03/2015  . Microcytic anemia 07/08/2015  . Pregnancy induced hypertension    Past Surgical History:  Procedure Laterality Date  . CESAREAN SECTION    . WISDOM TOOTH EXTRACTION  11/2013   Family History: family history includes Diabetes in her father and mother. Social History:  reports that she has never smoked. She has never used smokeless tobacco. She reports that she does not drink alcohol or use drugs.     Maternal Diabetes: No Genetic Screening: Normal Maternal Ultrasounds/Referrals: Normal Fetal Ultrasounds or other Referrals:  None Maternal Substance Abuse:  No Significant Maternal Medications:  None Significant Maternal Lab Results:  Group B Strep negative Other Comments:  previous C/S  Review of Systems  All other systems reviewed and are negative.  History   unknown if currently breastfeeding. Exam Physical Exam  Constitutional: She is oriented to person, place, and time. She appears well-developed and well-nourished.  HENT:  Head: Atraumatic.  Eyes: EOM are normal.  Cardiovascular: Normal rate.  Respiratory: Effort normal.  Musculoskeletal:     Cervical back: Neck supple.  Neurological: She is alert and oriented to person, place, and time.  Skin: Skin is warm and dry.   VE 6-7 cm /70%/-3/-2  Bulging membrane After AROM mec fluid  Prenatal labs: ABO, Rh:  B positive Antibody:  neg Rubella:  Immune RPR:   NR HBsAg:   neg HIV:   NR GBS:   negative  Assessment/Plan: Active phase  Post dates Previous LTCS with successful VBAC x 2 P) admit routine labs. Epidural  Pitocin  augmentation   Sanjna Haskew A Adesuwa Osgood 02/09/2019, 3:20 PM

## 2019-02-09 NOTE — Progress Notes (Signed)
Breathing with ctx  VE 6-7/70/-2 after arom IUPC placed  Tracing: baseline  150 (+) accel 170 (+) early decel  ctx q 2-4 mins   IMP: arrest of dilation Postdates P) epidural. Pitocin augmentation. Amnioinfusion prn

## 2019-02-09 NOTE — MAU Note (Signed)
.   Sandra Olsen is a 28 y.o. at [redacted]w[redacted]d here in MAU reporting: contractions throughout the day, VE, 9cm. LMP: Onset of complaint: ongoing all day Pain score: 10 Vitals:   02/09/19 1538  BP: 118/78  Pulse: 91  Temp: 98.2 F (36.8 C)     FHT:145 Lab orders placed from triage:

## 2019-02-09 NOTE — Anesthesia Preprocedure Evaluation (Signed)
Anesthesia Evaluation  Patient identified by MRN, date of birth, ID band Patient awake    Reviewed: Allergy & Precautions, NPO status , Patient's Chart, lab work & pertinent test results  Airway Mallampati: II  TM Distance: >3 FB Neck ROM: Full    Dental no notable dental hx. (+) Dental Advisory Given   Pulmonary neg pulmonary ROS,    Pulmonary exam normal        Cardiovascular hypertension, Pt. on medications Normal cardiovascular exam     Neuro/Psych negative neurological ROS  negative psych ROS   GI/Hepatic negative GI ROS, Neg liver ROS,   Endo/Other  negative endocrine ROS  Renal/GU negative Renal ROS  negative genitourinary   Musculoskeletal negative musculoskeletal ROS (+)   Abdominal   Peds negative pediatric ROS (+)  Hematology  (+) anemia ,   Anesthesia Other Findings   Reproductive/Obstetrics negative OB ROS                             Anesthesia Physical  Anesthesia Plan  ASA: III  Anesthesia Plan: Epidural   Post-op Pain Management:    Induction:   PONV Risk Score and Plan:   Airway Management Planned: Natural Airway  Additional Equipment:   Intra-op Plan:   Post-operative Plan:   Informed Consent: I have reviewed the patients History and Physical, chart, labs and discussed the procedure including the risks, benefits and alternatives for the proposed anesthesia with the patient or authorized representative who has indicated his/her understanding and acceptance.     Dental advisory given  Plan Discussed with: Anesthesiologist  Anesthesia Plan Comments:         Anesthesia Quick Evaluation

## 2019-02-10 ENCOUNTER — Encounter (HOSPITAL_COMMUNITY): Payer: Self-pay | Admitting: Obstetrics and Gynecology

## 2019-02-10 LAB — CBC
HCT: 30.2 % — ABNORMAL LOW (ref 36.0–46.0)
Hemoglobin: 9.2 g/dL — ABNORMAL LOW (ref 12.0–15.0)
MCH: 21.2 pg — ABNORMAL LOW (ref 26.0–34.0)
MCHC: 30.5 g/dL (ref 30.0–36.0)
MCV: 69.7 fL — ABNORMAL LOW (ref 80.0–100.0)
Platelets: 207 10*3/uL (ref 150–400)
RBC: 4.33 MIL/uL (ref 3.87–5.11)
RDW: 14.4 % (ref 11.5–15.5)
WBC: 10.1 10*3/uL (ref 4.0–10.5)
nRBC: 0 % (ref 0.0–0.2)

## 2019-02-10 LAB — RPR: RPR Ser Ql: NONREACTIVE

## 2019-02-10 NOTE — Anesthesia Postprocedure Evaluation (Signed)
Anesthesia Post Note  Patient: Sandra Olsen  Procedure(s) Performed: AN AD [place]     Patient location during evaluation: Mother Baby Anesthesia Type: Epidural Level of consciousness: awake Pain management: satisfactory to patient Vital Signs Assessment: post-procedure vital signs reviewed and stable Respiratory status: spontaneous breathing Cardiovascular status: stable Anesthetic complications: no    Last Vitals:  Vitals:   02/10/19 0103 02/10/19 0521  BP: 106/71 118/89  Pulse: 77 71  Resp: 18 18  Temp: 37.1 C 36.7 C  SpO2:      Last Pain:  Vitals:   02/10/19 0521  TempSrc: Oral  PainSc: 8    Pain Goal:                   [name]

## 2019-02-10 NOTE — Progress Notes (Signed)
PPD1 SVD:   S:  Pt reports feeling  well/ Tolerating po/ Voiding without problems/ No n/v/ Bleeding is moderate/ Pain controlled withprescription NSAID's including ibuprofen (Motrin)  Newborn info female BRF no circ   O:  A & O x 3 / VS: Blood pressure 106/66, pulse 85, temperature 98.2 F (36.8 C), temperature source Oral, resp. rate 20, height 4\' 10"  (1.473 m), weight 67.6 kg, SpO2 100 %, unknown if currently breastfeeding.  LABS:  Results for orders placed or performed during the hospital encounter of 02/09/19 (from the past 24 hour(s))  Respiratory Panel by RT PCR (Flu A&B, Covid) - Nasopharyngeal Swab     Status: None   Collection Time: 02/09/19  3:26 PM   Specimen: Nasopharyngeal Swab  Result Value Ref Range   SARS Coronavirus 2 by RT PCR NEGATIVE NEGATIVE   Influenza A by PCR NEGATIVE NEGATIVE   Influenza B by PCR NEGATIVE NEGATIVE  Type and screen [facility]     Status: None   Collection Time: 02/09/19  3:30 PM  Result Value Ref Range   ABO/RH(D) B POS    Antibody Screen NEG    Sample Expiration      02/12/2019,2359 Performed at [facility], [address], [place], [place]   ABO/Rh     Status: None   Collection Time: 02/09/19  3:30 PM  Result Value Ref Range   ABO/RH(D)      B POS Performed at [facility], [address], [place], [place]   CBC     Status: Abnormal   Collection Time: 02/09/19  3:59 PM  Result Value Ref Range   WBC 9.3 4.0 - 10.5 K/uL   RBC 5.05 3.87 - 5.11 MIL/uL   Hemoglobin 10.7 (L) 12.0 - 15.0 g/dL   HCT 02/11/19 73.5 - 32.9 %   MCV 72.3 (L) 80.0 - 100.0 fL   MCH 21.2 (L) 26.0 - 34.0 pg   MCHC 29.3 (L) 30.0 - 36.0 g/dL   RDW 92.4 26.8 - 34.1 %   Platelets 243 150 - 400 K/uL   nRBC 0.0 0.0 - 0.2 %  RPR     Status: None   Collection Time: 02/09/19  3:59 PM  Result Value Ref Range   RPR Ser Ql NON REACTIVE NON REACTIVE  CBC     Status: Abnormal   Collection Time: 02/10/19  5:49 AM  Result Value  Ref Range   WBC 10.1 4.0 - 10.5 K/uL   RBC 4.33 3.87 - 5.11 MIL/uL   Hemoglobin 9.2 (L) 12.0 - 15.0 g/dL   HCT 02/12/19 (L) 22.9 - 79.8 %   MCV 69.7 (L) 80.0 - 100.0 fL   MCH 21.2 (L) 26.0 - 34.0 pg   MCHC 30.5 30.0 - 36.0 g/dL   RDW 92.1 19.4 - 17.4 %   Platelets 207 150 - 400 K/uL   nRBC 0.0 0.0 - 0.2 %    I&O: I/O last 3 completed shifts: In: 151.6 [I.V.:151.6] Out: 300 [Urine:200; Blood:100]   No intake/output data recorded.  Lungs: chest clear, no wheezing, rales, normal symmetric air entry, Heart exam - S1, S2 normal, no murmur, no gallop, rate regular   Abdomen: uterus firm 1 FB umb  Perineum: is normal  Lochia: mod  Extremities:no redness or tenderness in the calves or thighs, no edema    A/P: PPD # 1/ 08.1  Doing well  Continue routine post partum orders  Home in  am planned

## 2019-02-10 NOTE — Discharge Instructions (Signed)
Per [facility] postpartum booklet

## 2019-02-10 NOTE — Progress Notes (Signed)
CSW received consult for anxiety.  CSW met with MOB to offer support and complete assessment.    MOB resting in bed holding infant with FOB present at bedside, when CSW entered the room. CSW introduced self and explained reason for consult to which MOB expressed understanding but appeared confused. CSW aware chart vaguely mentions anxiety and inquired about mental health history to which MOB denied having any. CSW asked if MOB experienced any anxiety or depression during pregnancy and MOB acknowledged mild anxiety in the beginning after finding out she was pregnant and adjusting to the news. MOB denied any other symptoms or concerns during pregnancy. MOB did mention mild baby blues with last pregnancy but it only lasted a day. CSW provided education regarding the baby blues period vs. perinatal mood disorders. CSW recommended self-evaluation during the postpartum time period using the New Mom Checklist from Postpartum Progress and encouraged MOB to contact a medical professional if symptoms are noted at any time.    MOB confirmed having all essential items for infant once discharged and reported infant would be sleeping in a crib once home. CSW provided review of Sudden Infant Death Syndrome (SIDS) precautions and safe sleeping habits.    CSW identifies no further need for intervention and no barriers to discharge at this time.  Thaddeaus Monica, LCSWA  [facility] [phone]  

## 2019-02-13 NOTE — Discharge Summary (Signed)
Obstetric Discharge Summary Reason for Admission: onset of labor Prenatal Procedures: ultrasound Intrapartum Procedures: spontaneous vaginal delivery Postpartum Procedures: none Complications-Operative and Postpartum:  2nd degree perineal laceration Hemoglobin  Date Value Ref Range Status  02/10/2019 9.2 (L) 12.0 - 15.0 g/dL Final   HCT  Date Value Ref Range Status  02/10/2019 30.2 (L) 36.0 - 46.0 % Final    Physical Exam:  General: alert, cooperative, and no distress Lochia: appropriate Uterine Fundus: firm Incision: n/a DVT Evaluation: No evidence of DVT seen on physical exam.  Discharge Diagnoses:  VBAC delivered  Discharge Information: Date: 02/13/2019 Activity: pelvic rest Diet: routine Medications: PNV and Ibuprofen Condition: stable Instructions: refer to practice specific booklet Discharge to: home Follow-up Information     Servando Salina, MD Follow up in 6 week(s).   Specialty: Obstetrics and Gynecology Contact information: [address] [place] [address] [phone]            Newborn Data: Live born female  Birth Weight: 8 lb 2.5 oz (3700 g) APGAR: 8, 9  Newborn Delivery   Birth date/time: 02/09/2019 17:54:00 Delivery type: VBAC, Spontaneous      Home with mother.  Sandra Olsen A Sandra Olsen 02/13/2019, 8:17 AM

## 2019-03-23 DIAGNOSIS — Z130 Encounter for screening for diseases of the blood and blood-forming organs and certain disorders involving the immune mechanism: Secondary | ICD-10-CM | POA: Diagnosis not present

## 2019-03-23 DIAGNOSIS — Z124 Encounter for screening for malignant neoplasm of cervix: Secondary | ICD-10-CM | POA: Diagnosis not present

## 2019-03-23 DIAGNOSIS — Z1151 Encounter for screening for human papillomavirus (HPV): Secondary | ICD-10-CM | POA: Diagnosis not present

## 2019-04-09 IMAGING — US US THYROID
1 series · 14 of 25 positions shown · non-contrast
Comparison: None available

CLINICAL DATA: Thyromegaly on exam

EXAM:
THYROID ULTRASOUND
TECHNIQUE: Ultrasound examination of the thyroid gland and adjacent soft
tissues was performed.

[Series 1: us thyroid · 0.04mm/px · 14 of 34 slices shown]
[im 1/34]
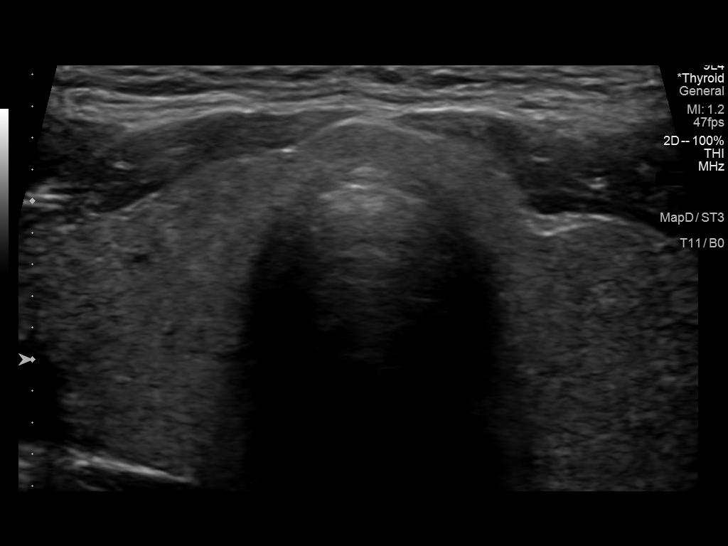
[im 3/34]
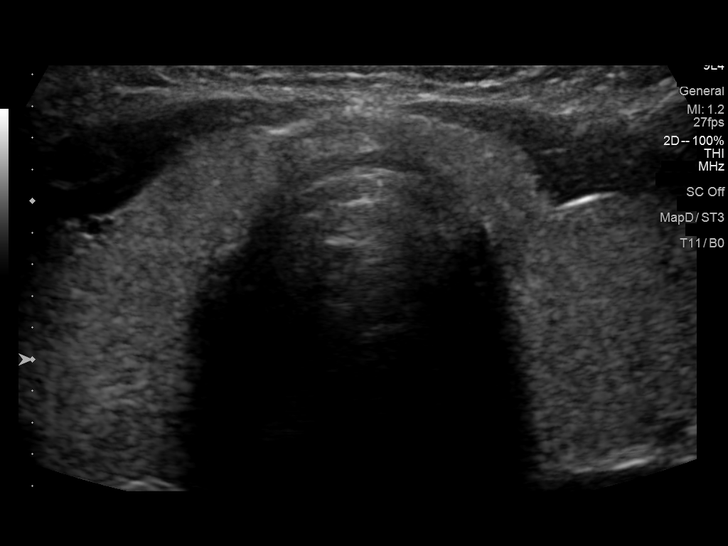
[im 6/34]
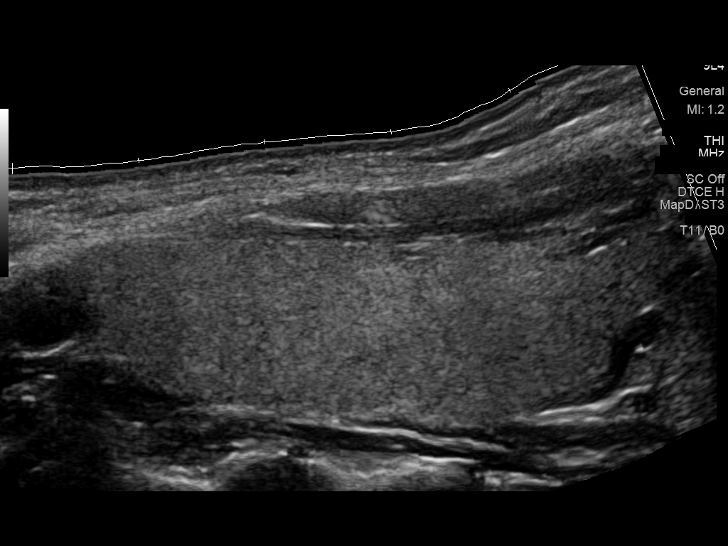
[im 9/34]
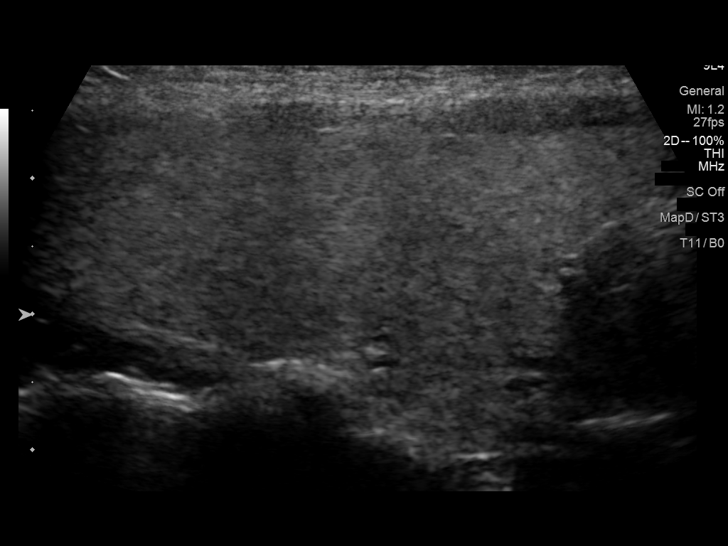
[im 12/34]
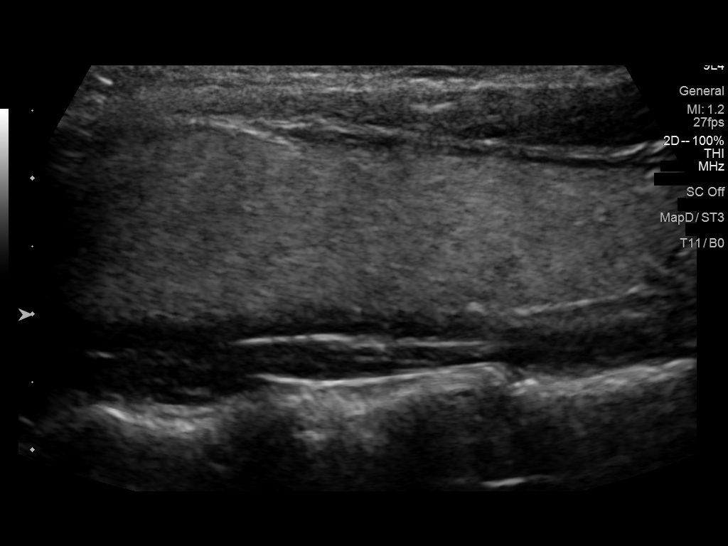
[im 13/34]
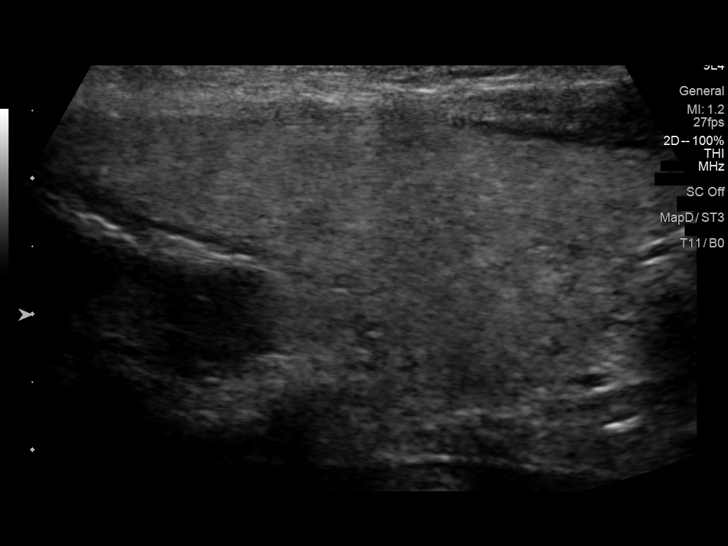
[im 16/34]
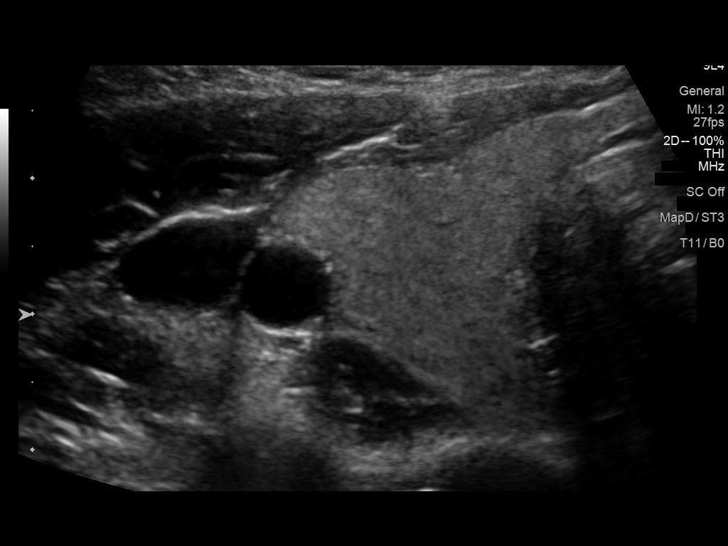
[im 18/34]
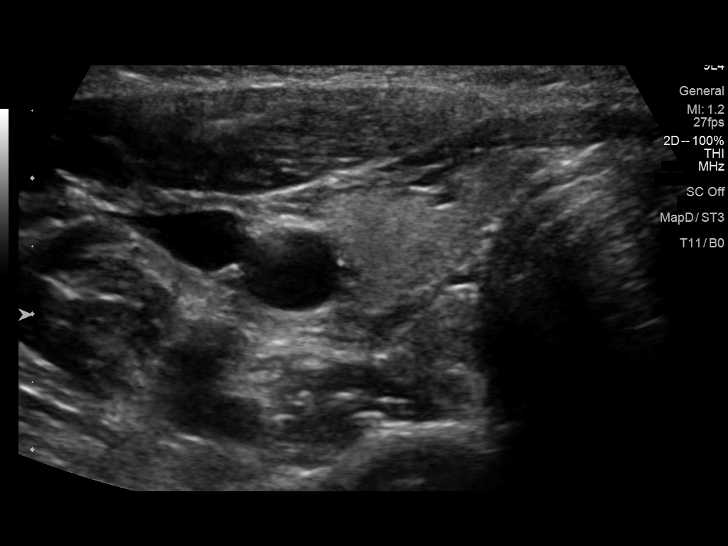
[im 21/34]
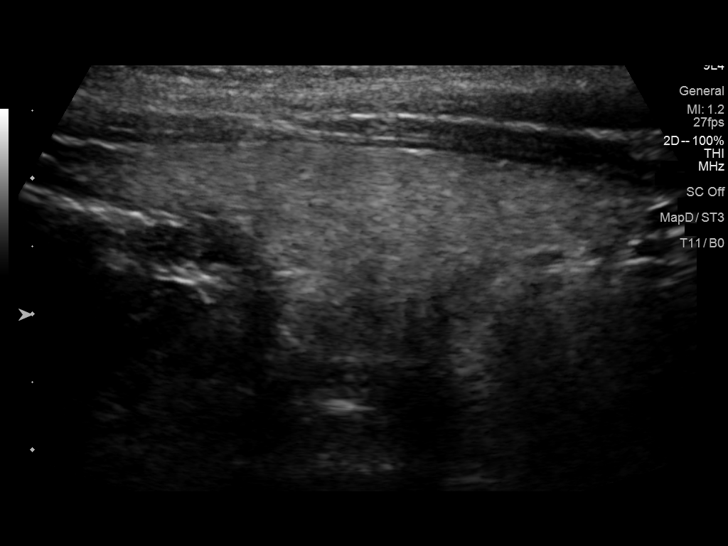
[im 23/34]
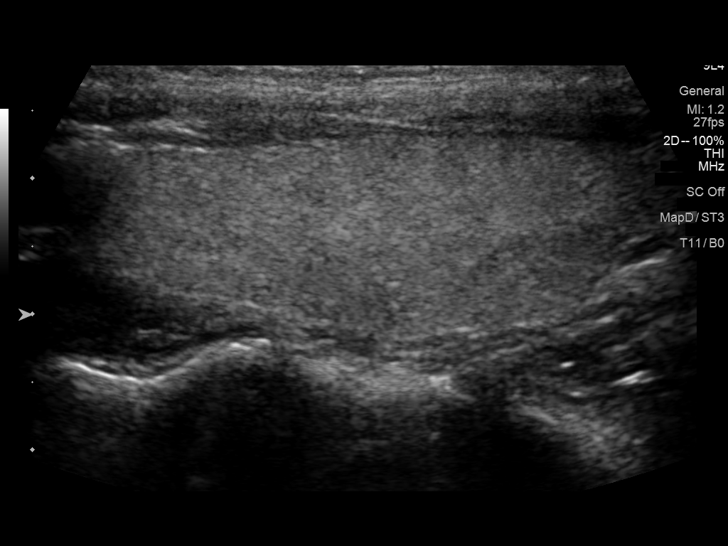
[im 25/34]
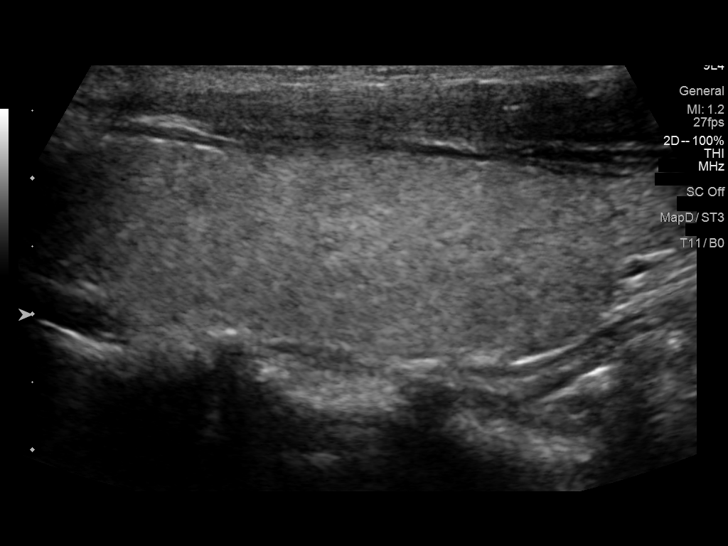
[im 28/34]
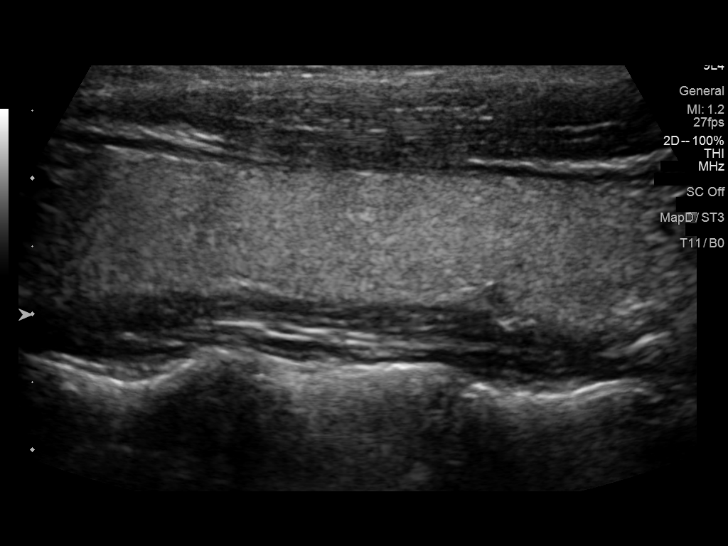
[im 31/34]
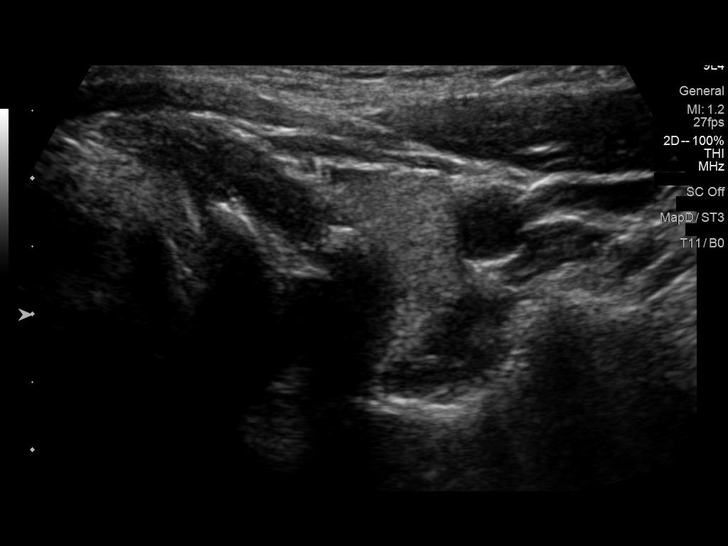
[im 34/34]
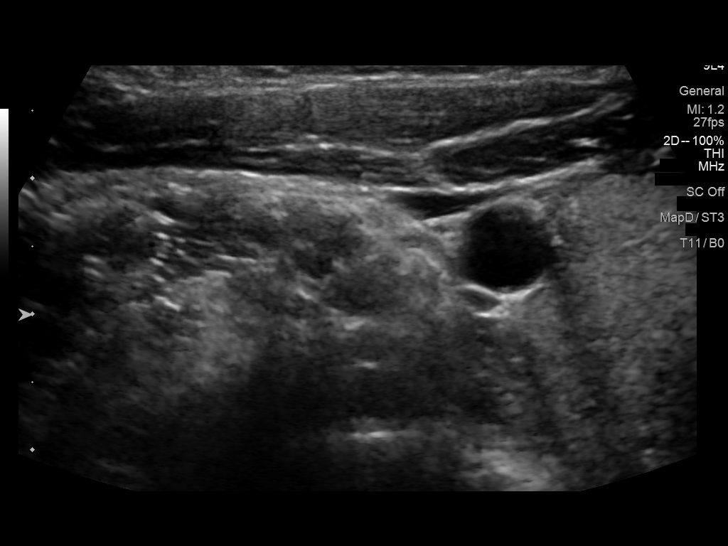

[14 of 25 positions shown; findings below may reference images not displayed]

FINDINGS: Parenchymal Echotexture: Normal

Isthmus: 3 mm

Right lobe: 4.8 x 1.5 x 2.0 cm

Left lobe: 4.8 x 1.7 x 1.9 cm

_________________________________________________________

Estimated total number of nodules >/= 1 cm: 0

Number of spongiform nodules >/=  2 cm not described below (TR1): 0

Number of mixed cystic and solid nodules >/= 1.5 cm not described
below (TR2): 0

_________________________________________________________

No discrete nodules are seen within the thyroid gland.
IMPRESSION: Normal thyroid ultrasound.

The above is in keeping with the ACR TI-RADS recommendations - [HOSPITAL] 9998;[DATE].

## 2019-06-30 DIAGNOSIS — H00024 Hordeolum internum left upper eyelid: Secondary | ICD-10-CM | POA: Diagnosis not present

## 2022-11-19 ENCOUNTER — Ambulatory Visit: Payer: Medicaid Other | Admitting: Diagnostic Neuroimaging
